# Patient Record
Sex: Male | Born: 1940 | Race: White | Hispanic: No | Smoking: Former smoker
Health system: Southern US, Community
[De-identification: ages and names within clinical notes are randomized; demographics above are authoritative.]

## PROBLEM LIST (undated history)

## (undated) DIAGNOSIS — I480 Paroxysmal atrial fibrillation: Secondary | ICD-10-CM

## (undated) DIAGNOSIS — D649 Anemia, unspecified: Secondary | ICD-10-CM

## (undated) DIAGNOSIS — I2510 Atherosclerotic heart disease of native coronary artery without angina pectoris: Secondary | ICD-10-CM

## (undated) DIAGNOSIS — Z4931 Encounter for adequacy testing for hemodialysis: Secondary | ICD-10-CM

## (undated) DIAGNOSIS — R55 Syncope and collapse: Secondary | ICD-10-CM

## (undated) DIAGNOSIS — N19 Unspecified kidney failure: Secondary | ICD-10-CM

## (undated) DIAGNOSIS — I10 Essential (primary) hypertension: Secondary | ICD-10-CM

## (undated) DIAGNOSIS — E039 Hypothyroidism, unspecified: Secondary | ICD-10-CM

## (undated) DIAGNOSIS — I493 Ventricular premature depolarization: Secondary | ICD-10-CM

## (undated) HISTORY — DX: Essential (primary) hypertension: I10

## (undated) HISTORY — DX: Ventricular premature depolarization: I49.3

## (undated) HISTORY — DX: Syncope and collapse: R55

## (undated) HISTORY — DX: Encounter for adequacy testing for hemodialysis: Z49.31

## (undated) HISTORY — DX: Atherosclerotic heart disease of native coronary artery without angina pectoris: I25.10

## (undated) HISTORY — DX: Paroxysmal atrial fibrillation: I48.0

## (undated) HISTORY — DX: Hypothyroidism, unspecified: E03.9

## (undated) HISTORY — PX: OTHER SURGICAL HISTORY: SHX169

## (undated) HISTORY — PX: CHOLECYSTECTOMY: SHX55

## (undated) HISTORY — DX: Anemia, unspecified: D64.9

## (undated) HISTORY — DX: Unspecified kidney failure: N19

## (undated) HISTORY — PX: CATARACT EXTRACTION: SUR2

---

## 2001-05-07 ENCOUNTER — Ambulatory Visit (HOSPITAL_COMMUNITY): Admission: RE | Admit: 2001-05-07 | Discharge: 2001-05-08 | Payer: Self-pay | Admitting: Ophthalmology

## 2001-05-07 ENCOUNTER — Encounter: Payer: Self-pay | Admitting: Ophthalmology

## 2005-10-24 ENCOUNTER — Encounter: Payer: Self-pay | Admitting: Cardiology

## 2009-01-03 ENCOUNTER — Ambulatory Visit (HOSPITAL_COMMUNITY): Admission: RE | Admit: 2009-01-03 | Discharge: 2009-01-04 | Payer: Self-pay | Admitting: Obstetrics and Gynecology

## 2009-07-26 ENCOUNTER — Ambulatory Visit: Payer: Self-pay | Admitting: Cardiology

## 2009-07-26 ENCOUNTER — Encounter: Payer: Self-pay | Admitting: Cardiology

## 2009-07-26 ENCOUNTER — Encounter: Payer: Self-pay | Admitting: Physician Assistant

## 2009-07-27 ENCOUNTER — Encounter: Payer: Self-pay | Admitting: Cardiology

## 2009-07-28 ENCOUNTER — Encounter: Payer: Self-pay | Admitting: Cardiology

## 2009-07-30 ENCOUNTER — Ambulatory Visit: Payer: Self-pay | Admitting: Cardiology

## 2009-07-30 ENCOUNTER — Encounter: Payer: Self-pay | Admitting: Cardiology

## 2009-07-30 ENCOUNTER — Inpatient Hospital Stay (HOSPITAL_COMMUNITY): Admission: RE | Admit: 2009-07-30 | Discharge: 2009-08-04 | Payer: Self-pay | Admitting: Cardiology

## 2009-08-01 ENCOUNTER — Encounter: Payer: Self-pay | Admitting: Internal Medicine

## 2009-08-03 ENCOUNTER — Encounter: Payer: Self-pay | Admitting: Cardiology

## 2009-08-04 ENCOUNTER — Ambulatory Visit: Payer: Self-pay | Admitting: Vascular Surgery

## 2009-08-04 ENCOUNTER — Encounter: Payer: Self-pay | Admitting: Cardiology

## 2009-08-04 ENCOUNTER — Encounter (INDEPENDENT_AMBULATORY_CARE_PROVIDER_SITE_OTHER): Payer: Self-pay | Admitting: Nephrology

## 2009-08-07 ENCOUNTER — Encounter: Payer: Self-pay | Admitting: Internal Medicine

## 2009-08-07 ENCOUNTER — Ambulatory Visit: Payer: Self-pay | Admitting: Cardiology

## 2009-08-07 ENCOUNTER — Telehealth: Payer: Self-pay | Admitting: Internal Medicine

## 2009-08-07 ENCOUNTER — Encounter: Payer: Self-pay | Admitting: Cardiology

## 2009-08-08 ENCOUNTER — Encounter: Payer: Self-pay | Admitting: Internal Medicine

## 2009-08-16 ENCOUNTER — Encounter: Payer: Self-pay | Admitting: Cardiology

## 2009-08-28 ENCOUNTER — Ambulatory Visit: Payer: Self-pay | Admitting: Surgery

## 2009-08-28 DIAGNOSIS — E1169 Type 2 diabetes mellitus with other specified complication: Secondary | ICD-10-CM

## 2009-08-28 DIAGNOSIS — I498 Other specified cardiac arrhythmias: Secondary | ICD-10-CM

## 2009-08-28 DIAGNOSIS — N179 Acute kidney failure, unspecified: Secondary | ICD-10-CM

## 2009-08-28 DIAGNOSIS — I2510 Atherosclerotic heart disease of native coronary artery without angina pectoris: Secondary | ICD-10-CM | POA: Insufficient documentation

## 2009-08-28 DIAGNOSIS — R55 Syncope and collapse: Secondary | ICD-10-CM | POA: Insufficient documentation

## 2009-08-28 DIAGNOSIS — D649 Anemia, unspecified: Secondary | ICD-10-CM

## 2009-08-28 DIAGNOSIS — E039 Hypothyroidism, unspecified: Secondary | ICD-10-CM | POA: Insufficient documentation

## 2009-08-28 DIAGNOSIS — E785 Hyperlipidemia, unspecified: Secondary | ICD-10-CM | POA: Insufficient documentation

## 2009-08-28 DIAGNOSIS — I6529 Occlusion and stenosis of unspecified carotid artery: Secondary | ICD-10-CM | POA: Insufficient documentation

## 2009-08-28 DIAGNOSIS — E1129 Type 2 diabetes mellitus with other diabetic kidney complication: Secondary | ICD-10-CM

## 2009-08-28 DIAGNOSIS — I499 Cardiac arrhythmia, unspecified: Secondary | ICD-10-CM | POA: Insufficient documentation

## 2009-08-28 DIAGNOSIS — I120 Hypertensive chronic kidney disease with stage 5 chronic kidney disease or end stage renal disease: Secondary | ICD-10-CM | POA: Insufficient documentation

## 2009-08-28 DIAGNOSIS — E875 Hyperkalemia: Secondary | ICD-10-CM

## 2009-08-29 ENCOUNTER — Encounter: Payer: Self-pay | Admitting: Cardiology

## 2009-08-29 ENCOUNTER — Ambulatory Visit: Payer: Self-pay | Admitting: Cardiology

## 2009-08-29 DIAGNOSIS — I259 Chronic ischemic heart disease, unspecified: Secondary | ICD-10-CM

## 2009-08-29 DIAGNOSIS — I2510 Atherosclerotic heart disease of native coronary artery without angina pectoris: Secondary | ICD-10-CM | POA: Insufficient documentation

## 2009-09-05 ENCOUNTER — Ambulatory Visit (HOSPITAL_COMMUNITY): Admission: RE | Admit: 2009-09-05 | Discharge: 2009-09-05 | Payer: Self-pay | Admitting: Surgery

## 2009-09-05 ENCOUNTER — Ambulatory Visit: Payer: Self-pay | Admitting: Surgery

## 2009-09-19 ENCOUNTER — Ambulatory Visit: Payer: Self-pay | Admitting: Vascular Surgery

## 2009-09-19 ENCOUNTER — Encounter: Payer: Self-pay | Admitting: Cardiology

## 2009-09-22 ENCOUNTER — Ambulatory Visit (HOSPITAL_COMMUNITY): Admission: RE | Admit: 2009-09-22 | Discharge: 2009-09-22 | Payer: Self-pay | Admitting: Vascular Surgery

## 2009-09-29 ENCOUNTER — Encounter: Payer: Self-pay | Admitting: Cardiology

## 2009-10-01 ENCOUNTER — Encounter: Payer: Self-pay | Admitting: Cardiology

## 2009-10-01 ENCOUNTER — Ambulatory Visit: Payer: Self-pay | Admitting: Cardiology

## 2009-10-02 ENCOUNTER — Encounter: Payer: Self-pay | Admitting: Cardiology

## 2009-10-03 ENCOUNTER — Encounter: Payer: Self-pay | Admitting: Cardiology

## 2009-10-04 ENCOUNTER — Encounter: Payer: Self-pay | Admitting: Cardiology

## 2009-10-06 ENCOUNTER — Encounter: Payer: Self-pay | Admitting: Cardiology

## 2009-10-14 ENCOUNTER — Emergency Department (HOSPITAL_COMMUNITY): Admission: EM | Admit: 2009-10-14 | Discharge: 2009-10-14 | Payer: Self-pay | Admitting: Emergency Medicine

## 2009-10-15 ENCOUNTER — Inpatient Hospital Stay (HOSPITAL_COMMUNITY): Admission: EM | Admit: 2009-10-15 | Discharge: 2009-10-17 | Payer: Self-pay | Admitting: Emergency Medicine

## 2009-10-16 ENCOUNTER — Ambulatory Visit: Payer: Self-pay | Admitting: Vascular Surgery

## 2009-10-16 ENCOUNTER — Encounter: Payer: Self-pay | Admitting: Vascular Surgery

## 2009-10-18 ENCOUNTER — Encounter: Payer: Self-pay | Admitting: Cardiology

## 2009-10-23 ENCOUNTER — Telehealth: Payer: Self-pay | Admitting: Internal Medicine

## 2009-10-25 ENCOUNTER — Ambulatory Visit: Payer: Self-pay | Admitting: Internal Medicine

## 2009-11-01 ENCOUNTER — Ambulatory Visit: Payer: Self-pay | Admitting: Vascular Surgery

## 2009-11-10 ENCOUNTER — Ambulatory Visit: Payer: Self-pay | Admitting: Internal Medicine

## 2009-11-13 ENCOUNTER — Ambulatory Visit: Payer: Self-pay | Admitting: Vascular Surgery

## 2009-11-13 ENCOUNTER — Ambulatory Visit (HOSPITAL_COMMUNITY): Admission: RE | Admit: 2009-11-13 | Discharge: 2009-11-13 | Payer: Self-pay | Admitting: Vascular Surgery

## 2009-11-13 ENCOUNTER — Emergency Department (HOSPITAL_COMMUNITY): Admission: EM | Admit: 2009-11-13 | Discharge: 2009-11-13 | Payer: Self-pay | Admitting: Emergency Medicine

## 2009-12-15 ENCOUNTER — Ambulatory Visit: Payer: Self-pay | Admitting: Vascular Surgery

## 2009-12-22 ENCOUNTER — Encounter: Payer: Self-pay | Admitting: Cardiology

## 2010-01-01 ENCOUNTER — Telehealth: Payer: Self-pay | Admitting: Internal Medicine

## 2010-01-03 ENCOUNTER — Ambulatory Visit: Payer: Self-pay | Admitting: Cardiology

## 2010-03-26 ENCOUNTER — Ambulatory Visit (HOSPITAL_COMMUNITY): Admission: RE | Admit: 2010-03-26 | Discharge: 2010-03-26 | Payer: Self-pay | Admitting: Nephrology

## 2010-03-26 ENCOUNTER — Encounter: Payer: Self-pay | Admitting: Cardiology

## 2010-06-01 ENCOUNTER — Ambulatory Visit: Payer: Self-pay | Admitting: Internal Medicine

## 2010-07-20 ENCOUNTER — Encounter: Payer: Self-pay | Admitting: Cardiology

## 2010-07-20 ENCOUNTER — Ambulatory Visit (HOSPITAL_COMMUNITY): Admission: RE | Admit: 2010-07-20 | Discharge: 2010-07-20 | Payer: Self-pay | Admitting: Nephrology

## 2010-08-03 ENCOUNTER — Ambulatory Visit: Payer: Self-pay | Admitting: Cardiology

## 2010-11-01 ENCOUNTER — Encounter: Payer: Self-pay | Admitting: Internal Medicine

## 2010-11-11 ENCOUNTER — Encounter: Payer: Self-pay | Admitting: Nephrology

## 2010-11-20 NOTE — Assessment & Plan Note (Signed)
Summary: pc2   Current Medications (verified): 1)  Metoprolol Succinate 50 Mg Xr24h-Tab (Metoprolol Succinate) .... Take 1 Tablet By Mouth Once A Day 2)  Simvastatin 40 Mg Tabs (Simvastatin) .... Take 1 Tablet By Mouth Once A Day 3)  Aspirin 325 Mg Tabs (Aspirin) .... Take 1 Tablet By Mouth Once A Day 4)  Calcium Acetate 667 Mg Caps (Calcium Acetate (Phos Binder)) .... Take 1 Tablet By Mouth Three Times A Day With Meals 5)  Humulin N 100 Unit/ml Susp (Insulin Isophane Human) .... Use As Directed 6)  Levothroid 175 Mcg Tabs (Levothyroxine Sodium) .... Take 1 Tablet By Mouth Once A Day 7)  Plavix 75 Mg Tabs (Clopidogrel Bisulfate) .... Take 1 Tablet By Mouth Once A Day 8)  Dialyvite 3000 3 Mg Tabs (B Complex-C-Biotin-E-Min-Fa) .... Take 1 Tablet By Mouth Once A Day  Allergies (verified): No Known Drug Allergies  Past Cardiac History:  [facility] Hospitalizations: FINAL DIAGNOSES: 1. Admitted to [facility] with syncope.     a.     No dysrhythmia on telemetry at [facility] on October      8 and October 9. 2. Myoview study done at [facility].     a.     Multiple large fixed defects.     b.     Ejection fraction 39%. 3. Echocardiogram at [facility] on October 7, ejection fraction of 50-     55%. 4. Stage IV chronic kidney disease.     a.     Secondary to diabetes and hypertension.     b.     Impending hemodialysis.     c.     Vein mapping faxed to [facility] at [phone] on October 15. 5. Nephrology consult this admission.     a.     Suggesting we hold ramipril and Diovan.     b.     Ordered vein mapping study. 6. Left heart catheterization on July 31, 2009, a diagnostic study.     a.     The patient had a 99% stenosis in the left anterior      descending after the diagonal.     b.     Severe distal right coronary artery/posterior descending      artery/posterolateral branches 1 and 2 disease 90% stenoses all      around. 7. Followup echocardiogram on August 01, 2009, akinesis of  the apex     (left anterior descending not a candidate for percutaneous coronary     intervention, but the right coronary artery is a candidate for     percutaneous coronary intervention). 8. Percutaneous transluminal coronary angioplasty stenting x4 on     August 02, 2009.  Stents were placed in the posterior descending     artery, the posterolateral branches 1and 2 and the distal right     coronary artery, 4 drug-eluting stents.     a.     Aspirin and Plavix for 12 months longer if tolerated. 9. Electrophysiology study on August 04, 2009.     a.     Non-inducible ventricular tachycardia/ventricular      fibrillation.     b.     Loop implanted a Medtronic Reveal, the patient discharging      on August 04, 2009. 07/30/2009 - 09/04/2009:  Angina (syncope)    ILR Following MD Hillis Range, MD DOI:  08/04/2009 Vendor:  Medtronic     Model Number:  [id number]     Serial Number [id number]  Tachy Episodes:  0     Brady Episodes:  0 ILR Next Due 08/21/2010  Tech Comments:  NORMAL LOOP CHECK. NO EPISODES SINCE LAST CHECK.  PT HAVING NO PROBLEMS.  ROV IN 3 MTHS W/JA. Vella Kohler  June 04, 2010 2:45 PM

## 2010-11-20 NOTE — Assessment & Plan Note (Signed)
Summary: pc2 medtronic   Visit Type:  Pacemaker check Primary Provider:  Tapper  CC:  pacercheck.  History of Present Illness: The patient presents today for routine electrophysiology followup. He reports doing very well since last being seen in our clinic.  He denies any further episodes of syncope.  The patient denies symptoms of palpitations, chest pain, shortness of breath, orthopnea, PND, lower extremity edema, dizziness, presyncope, syncope, or neurologic sequela. The patient is tolerating medications without difficulties and is otherwise without complaint today.   Preventive Screening-Counseling & Management  Alcohol-Tobacco     Smoking Status: quit     Year Quit: 1988  Current Medications (verified): 1)  Amlodipine Besylate 5 Mg Tabs (Amlodipine Besylate) .... Take 1 Tablet By Mouth Once A Day 2)  Metoprolol Succinate 50 Mg Xr24h-Tab (Metoprolol Succinate) .... Take 1 Tablet By Mouth Once A Day 3)  Simvastatin 40 Mg Tabs (Simvastatin) .... Take 1 Tablet By Mouth Once A Day 4)  Aspirin 325 Mg Tabs (Aspirin) .... Take 1 Tablet By Mouth Once A Day 5)  Calcium Acetate 667 Mg Caps (Calcium Acetate (Phos Binder)) .... Take 1 Tablet By Mouth Three Times A Day With Meals 6)  Humulin N 100 Unit/ml Susp (Insulin Isophane Human) .... Use As Directed 7)  Levothroid 175 Mcg Tabs (Levothyroxine Sodium) .... Take 1 Tablet By Mouth Once A Day 8)  Plavix 75 Mg Tabs (Clopidogrel Bisulfate) .... Take 1 Tablet By Mouth Once A Day 9)  Dialyvite 3000 3 Mg Tabs (B Complex-C-Biotin-E-Min-Fa) .... Take 1 Tablet By Mouth Once A Day 10)  Lorazepam 0.5 Mg Tabs (Lorazepam) .... Take 1 Tablet By Mouth Two Times A Day  Allergies (verified): No Known Drug Allergies  Comments:  Nurse/Medical Assistant: The patient's medications and allergies were reviewed with the patient and were updated in the Medication and Allergy Lists. List reviewed.  Past History:  Past Medical History: Recurrent unexplained  syncope noniducible EP study ILR placed  Persumed coronary artery disease  a. Abnormal perfusion imaging study with multipe, large fixed defects, EF 39%  b. EF 50-55 % with apical akinesis by 2 D Echo Ventricular ectopy a. no documented nonsustained ventricular tachycardia Multiple cardiac risk factors a. Insulin dependent Diabetes mellitus b. hypertension c. History of Tobacco d. Age Chronic Renal Failure Chronic Anemia Hypothyroidism status post stent to the LAD 07/30/2009 a. 50-69% right ICA stenois on this admission  Past Surgical History: Reviewed history from 08/28/2009 and no changes required. Cataract Extraction Cholecystectomy  Past Cardiac History:  [facility] Hospitalizations: FINAL DIAGNOSES: 1. Admitted to [facility] with syncope.     a.     No dysrhythmia on telemetry at [facility] on October      8 and October 9. 2. Myoview study done at [facility].     a.     Multiple large fixed defects.     b.     Ejection fraction 39%. 3. Echocardiogram at [facility] on October 7, ejection fraction of 50-     55%. 4. Stage IV chronic kidney disease.     a.     Secondary to diabetes and hypertension.     b.     Impending hemodialysis.     c.     Vein mapping [facility]ed to [facility] at [phone] on October 15. 5. Nephrology consult this admission.     a.     Suggesting we hold ramipril and Diovan.     b.     Ordered vein mapping study.  6. Left heart catheterization on July 31, 2009, a diagnostic study.     a.     The patient had a 99% stenosis in the left anterior      descending after the diagonal.     b.     Severe distal right coronary artery/posterior descending      artery/posterolateral branches 1 and 2 disease 90% stenoses all      around. 7. Followup echocardiogram on August 01, 2009, akinesis of the apex     (left anterior descending not a candidate for percutaneous coronary     intervention, but the right coronary artery is a candidate for     percutaneous  coronary intervention). 8. Percutaneous transluminal coronary angioplasty stenting x4 on     August 02, 2009.  Stents were placed in the posterior descending     artery, the posterolateral branches 1and 2 and the distal right     coronary artery, 4 drug-eluting stents.     a.     Aspirin and Plavix for 12 months longer if tolerated. 9. Electrophysiology study on August 04, 2009.     a.     Non-inducible ventricular tachycardia/ventricular      fibrillation.     b.     Loop implanted a Medtronic Reveal, the patient discharging      on August 04, 2009. 07/30/2009 - 09/04/2009:  Angina (syncope)  Social History: Reviewed history from 08/28/2009 and no changes required. Widowed  Alcohol Use - no Retired (worked Haematologist a Arboriculturist) smoked cigars but quit smoking in 1987 after smoking for 20 yrs.  Vital Signs:  Patient profile:   70 year old male Height:      72 inches Weight:      170 pounds Pulse rate:   80 / minute BP sitting:   182 / 113  (left arm) Cuff size:   regular  Vitals Entered By: Carlye Grippe (November 10, 2009 1:44 PM)  Serial Vital Signs/Assessments:  Time      Position  BP       Pulse  Resp  Temp     By 1:54 PM             164/84   79                    Carlye Grippe  CC: pacercheck   Physical Exam  General:  Well developed, well nourished, in no acute distress. Head:  normocephalic and atraumatic Eyes:  PERRLA/EOM intact; conjunctiva and lids normal. Mouth:  Teeth, gums and palate normal. Oral mucosa normal. Neck:  Neck supple, no JVD. No masses, thyromegaly or abnormal cervical nodes. Chest Wall:  ILR site well healed Lungs:  Clear bilaterally to auscultation and percussion. Heart:  Non-displaced PMI, chest non-tender; regular rate and rhythm, S1, S2 without murmurs, rubs or gallops. Carotid upstroke normal, no bruit. Normal abdominal aortic size, no bruits. Femorals normal pulses, no bruits. Pedals normal pulses. No edema, no  varicosities. Abdomen:  Bowel sounds positive; abdomen soft and non-tender without masses, organomegaly, or hernias noted. No hepatosplenomegaly. Msk:  Back normal, normal gait. Muscle strength and tone normal. Pulses:  pulses normal in all 4 extremities Extremities:  No clubbing or cyanosis. Neurologic:  Alert and oriented x 3.    ILR Following MD Hillis Range, MD DOI:  08/04/2009 Vendor:  Medtronic     Model Number:  [id number]     Serial Number [id number]  Tech Comments:  No episodes of any arrythmias.  ROV 6 months [facility]. [employer] RN BSN  November 10, 2009 2:13 PM   MD Comments:  agree  Impression & Recommendations:  Problem # 1:  SYNCOPE AND COLLAPSE (ICD-780.2) No further episodes of syncope ILR reviewed as above No changes today Pt to contact us if further syncope  His updated medication list for this problem includes:    Amlodipine Besylate 5 Mg Tabs (Amlodipine besylate) .Marland Kitchen... Take 1 tablet by mouth once a day    Metoprolol Succinate 50 Mg Xr24h-tab (Metoprolol succinate) .Marland Kitchen... Take 1 tablet by mouth once a day    Aspirin 325 Mg Tabs (Aspirin) .Marland Kitchen... Take 1 tablet by mouth once a day    Plavix 75 Mg Tabs (Clopidogrel bisulfate) .Marland Kitchen... Take 1 tablet by mouth once a day  Problem # 2:  HTN CKD UNSPEC W/CKD STAGE V/ESRD (ICD-403.91) BP above goal Pt reluctant to increase meds due to hypotension with HD Salt restriction advised  The following medications were removed from the medication list:    Furosemide 20 Mg Tabs (Furosemide) .Marland Kitchen... Take 1 tablet by mouth once a day His updated medication list for this problem includes:    Amlodipine Besylate 5 Mg Tabs (Amlodipine besylate) .Marland Kitchen... Take 1 tablet by mouth once a day    Metoprolol Succinate 50 Mg Xr24h-tab (Metoprolol succinate) .Marland Kitchen... Take 1 tablet by mouth once a day    Aspirin 325 Mg Tabs (Aspirin) .Marland Kitchen... Take 1 tablet by mouth once a day  Problem # 3:  LEFT VENTRICULAR FUNCTION, DECREASED  (ICD-429.2) Stable no changes  Patient Instructions: 1)  return in 6 months

## 2010-11-20 NOTE — Consult Note (Signed)
Summary: CARDIOLOGY CONSULT [facility]  CARDIOLOGY CONSULT [facility]   Imported By: Zachary George 01/02/2010 19:42:19  _____________________________________________________________________  External Attachment:    Type:   Image     Comment:   External Document

## 2010-11-20 NOTE — Cardiovascular Report (Signed)
Summary: Office Visit   Office Visit   Imported By: Roderic Ovens 06/11/2010 13:14:08  _____________________________________________________________________  External Attachment:    Type:   Image     Comment:   External Document

## 2010-11-20 NOTE — Assessment & Plan Note (Signed)
Summary: 6 mo fu per spet reminder-srs   Visit Type:  Follow-up Primary Provider:  Dr. Wyvonnia Lora   History of Present Illness: the patient is a 70 year old male with end-stage renal disease on hemodialysis, anemia and trouble cytopenia. Has a prior history of syncope which has been evaluated with an implantable loop monitor as well as an EP study which was negative for inducible ventricular tachycardia. Interestingly the patient's episodes of syncope have resolved and he is doing much better. He denies any chest pain shortness of breath orthopnea or PND.  Recently he had a poor healing foot ulcer and underwent ABIs. He was found to have bilateral lower showed the arterial occlusive disease primarily infrapopliteal of moderate to severe degree. His foot ulcer has healed. The patient denies any claudications.  An echocardiogram done 6 months ago was noted for essentially near normal ejection fraction of 45-50% with segmental wall motion abnormalities. The patient denies having chest pain.the patient underwent prior stenting of the right PDA right heel one and pO2 and distal right coronary artery with 4 drug eluting stents. The recommendation is lifelong therapy with dual antiplatelet therapy.  Preventive Screening-Counseling & Management  Alcohol-Tobacco     Smoking Status: quit  Comments: Pt smoked cigars but quit in the 80's.  Current Medications (verified): 1)  Metoprolol Succinate 50 Mg Xr24h-Tab (Metoprolol Succinate) .... Take 1 Tablet By Mouth Once A Day 2)  Simvastatin 40 Mg Tabs (Simvastatin) .... Take 1 Tablet By Mouth Once A Day 3)  Aspirin 325 Mg Tabs (Aspirin) .... Take 1 Tablet By Mouth Once A Day 4)  Calcium Acetate 667 Mg Caps (Calcium Acetate (Phos Binder)) .... Take 1 Tablet By Mouth Three Times A Day With Meals 5)  Humulin N 100 Unit/ml Susp (Insulin Isophane Human) .... Use As Directed 6)  Levothroid 175 Mcg Tabs (Levothyroxine Sodium) .... Take 1 Tablet By Mouth Once  A Day 7)  Plavix 75 Mg Tabs (Clopidogrel Bisulfate) .... Take 1 Tablet By Mouth Once A Day 8)  Dialyvite 3000 3 Mg Tabs (B Complex-C-Biotin-E-Min-Fa) .... Take 1 Tablet By Mouth Once A Day 9)  Miralax  Powd (Polyethylene Glycol 3350) .... Use As Directed As Needed  Allergies: No Known Drug Allergies  Comments:  Nurse/Medical Assistant: The patient's medications were reviewed with the patient and were updated in the Medication List. Pt brought a list of medications to office visit.  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  Risk Factors: Smoking Status: quit (08/03/2010)  Past Medical History: Recurrent unexplained syncope noniducible EP study ILR placed  coronary artery disease  a. Abnormal perfusion imaging study with multipe, large fixed defects, EF 39%  b. EF 50-55 % with apical akinesis by 2 D Echo Status post coronary intervention with drug-eluting stent placement x 24 July 2009 ejection fraction 40 to 45%. Ventricular ectopy a. no documented nonsustained ventricular tachycardia Multiple cardiac risk factors a. Insulin dependent Diabetes mellitus b. hypertension c. History of Tobacco d. Age Chronic Renal Failure, on hemodialysis. Chronic Anemia Hypothyroidism status post stent to the LAD 07/30/2009 a. 50-69% right ICA stenois on this admission status post septicemia with Serratia and  infected left arm fistula.  Status post removal left arm fistula with new right arm fistula.  Hemodialysis through a Vas-Cath catheter ABIs March 2011 bilateral lower sugar to  loosen disease primarily infrapopliteal moderate in severity MRA recommended if fails conservative therapy.  Past Cardiac History:  [facility] Hospitalizations: FINAL DIAGNOSES: 1. Admitted to [facility] with syncope.     a.     No dysrhythmia on telemetry at [facility] on October      8 and October 9. 2. Myoview study done at [facility].     a.     Multiple large fixed defects.     b.     Ejection fraction 39%. 3. Echocardiogram at [facility] on October 7, ejection fraction of 50-     55%. 4. Stage IV chronic kidney disease.     a.     Secondary to diabetes and hypertension.     b.     Impending hemodialysis.     c.     Vein mapping [facility]ed to [facility] at [phone] on October 15. 5. Nephrology consult this admission.     a.     Suggesting we hold ramipril and Diovan.     b.     Ordered vein mapping study. 6. Left heart catheterization on July 31, 2009, a diagnostic study.     a.     The patient had a 99% stenosis in the left anterior      descending after the diagonal.     b.     Severe distal right coronary artery/posterior descending      artery/posterolateral branches 1 and 2 disease 90% stenoses all      around. 7. Followup echocardiogram on August 01, 2009, akinesis of the apex     (left anterior descending not a candidate for percutaneous coronary     intervention, but the right coronary artery is a candidate for     percutaneous coronary intervention). 8. Percutaneous transluminal coronary angioplasty stenting x4 on     August 02, 2009.  Stents were placed in the posterior descending     artery, the posterolateral branches 1and 2 and the distal right     coronary artery, 4 drug-eluting stents.     a.     Aspirin and Plavix for 12 months longer if tolerated. 9. Electrophysiology study on August 04, 2009.     a.     Non-inducible ventricular tachycardia/ventricular      fibrillation.     b.     Loop implanted a Medtronic Reveal, the patient discharging      on August 04, 2009. 07/30/2009 - 09/04/2009:  Angina (syncope)  Social History: Smoking Status:  quit  Review of Systems  The patient denies fatigue, malaise, fever, weight gain/loss, vision loss, decreased hearing, hoarseness, chest pain, palpitations, shortness of breath, prolonged cough, wheezing, sleep apnea, coughing up blood, abdominal pain, blood in stool, nausea, vomiting, diarrhea, heartburn, incontinence, blood in urine, muscle weakness, joint pain, leg swelling, rash, skin lesions, headache, fainting, dizziness, depression, anxiety, enlarged lymph nodes, easy bruising or bleeding, and environmental allergies.    Vital Signs:  Patient profile:   70 year old male Height:      72 inches Weight:      189.25 pounds BMI:     25.76 Pulse rate:   60 / minute BP sitting:   165 / 81  (left arm) Cuff size:   regular  Vitals Entered By: Cyril Loosen, RN, BSN (August 03, 2010 10:34 AM)  Nutrition Counseling: Patient's BMI is greater than 25 and therefore counseled on weight management options. Comments Follow up visit. No cardiac complaints.   Physical Exam  Additional Exam:  General: Well-developed, well-nourished in no distress head: Normocephalic and atraumatic eyes PERRLA/EOMI intact, conjunctiva and lids normal nose: No deformity or lesions mouth normal dentition, normal posterior pharynx neck: Supple, no JVD.  No masses, thyromegaly or abnormal cervical nodes Chest: Vas-Cath catheter in place, lungs: Normal breath sounds bilaterally without wheezing.  Normal percussion heart: regular rate and rhythm with normal S1 and S2, no S3 or S4.  PMI is normal.  No pathological murmurs abdomen: Normal bowel sounds, abdomen is soft and nontender without masses, organomegaly or hernias noted.  No hepatosplenomegaly musculoskeletal: Back normal, normal gait muscle strength and tone normal pulsus: Pulse is normal in all 4 extremities Extremities: No peripheral pitting edema, status post removal  left arm fistula and new right arm fistula. neurologic: Alert and oriented x 3 skin: Intact without lesions or rashes cervical nodes: No significant adenopathy psychologic: Normal affect     ILR Following MD Hillis Range, MD DOI:  08/04/2009 Vendor:  Medtronic     Model Number:  [id number]     Serial Number [id number]        Impression & Recommendations:  Problem # 1:  LEFT VENTRICULAR FUNCTION, DECREASED (ICD-429.2) mild LV dysfunction but no heart failure symptoms. Continue current medical therapy.  Problem # 2:  CORONARY ARTERY DISEASE, S/P PTCA (ICD-414.9) status post multiple drug eluting stent placements. No recurrent chest pain. Continue dual antiplatelet therapy. His updated medication list for this problem includes:    Metoprolol Succinate 50 Mg Xr24h-tab (Metoprolol succinate) .Marland Kitchen... Take 1 tablet by mouth once a day    Aspirin 325 Mg Tabs (Aspirin) .Marland Kitchen... Take 1 tablet by mouth once a day    Plavix 75 Mg Tabs (Clopidogrel bisulfate) .Marland Kitchen... Take 1 tablet by mouth once a day  Problem # 3:  HTN CKD UNSPEC W/CKD STAGE V/ESRD (ICD-403.91) patient is on hemodialysis. He has had no recurrent syncope His updated medication list for this problem includes:    Metoprolol Succinate 50 Mg Xr24h-tab (Metoprolol succinate) .Marland Kitchen... Take 1 tablet by mouth once a day    Aspirin 325 Mg Tabs (Aspirin) .Marland Kitchen... Take 1 tablet by mouth once a day  Problem # 4:  HYPERLIPIDEMIA (ICD-272.4) followed by his primary care physician His updated medication list for this problem includes:    Simvastatin 40 Mg Tabs (Simvastatin) .Marland Kitchen... Take 1 tablet by mouth once a day  Appended Document: [facility]     Allergies: No Known Drug Allergies  Past Cardiac History:  [facility] Hospitalizations: FINAL DIAGNOSES: 1. Admitted to [facility] with syncope.     a.     No dysrhythmia on telemetry at [facility] on October      8 and October 9. 2. Myoview study done at [facility].     a.     Multiple  large fixed defects.     b.     Ejection fraction 39%. 3. Echocardiogram at [facility] on October 7, ejection fraction of 50-     55%. 4. Stage IV chronic kidney disease.     a.     Secondary to diabetes and hypertension.     b.     Impending hemodialysis.     c.     Vein [facility] at [phone] on October 15. 5. Nephrology consult this admission.  a.     Suggesting we hold ramipril and Diovan.     b.     Ordered vein mapping study. 6. Left heart catheterization on July 31, 2009, a diagnostic study.     a.     The patient had a 99% stenosis in the left anterior      descending after the diagonal.     b.     Severe distal right coronary artery/posterior descending      artery/posterolateral branches 1 and 2 disease 90% stenoses all      around. 7. Followup echocardiogram on August 01, 2009, akinesis of the apex     (left anterior descending not a candidate for percutaneous coronary     intervention, but the right coronary artery is a candidate for     percutaneous coronary intervention). 8. Percutaneous transluminal coronary angioplasty stenting x4 on     August 02, 2009.  Stents were placed in the posterior descending     artery, the posterolateral branches 1and 2 and the distal right     coronary artery, 4 drug-eluting stents.     a.     Aspirin and Plavix for 12 months longer if tolerated. 9. Electrophysiology study on August 04, 2009.     a.     Non-inducible ventricular tachycardia/ventricular      fibrillation.     b.     Loop implanted a Medtronic Reveal, the patient discharging      on August 04, 2009. 07/30/2009 - 09/04/2009:  Angina (syncope)    ILR Following MD Hillis Range, MD DOI:  08/04/2009 Vendor:  Medtronic     Model Number:  [id number]     Serial Number [id number]        Patient Instructions: 1)  Your physician recommends that you continue on your current medications as directed. Please refer to the Current Medication list given to you today. 2)   .Follow up in  6 months

## 2010-11-20 NOTE — Consult Note (Signed)
Summary: Consultation Report  (DR. Kristian Covey)  Consultation Report  (DR. Kristian Covey)   Imported By: Zachary George 01/02/2010 19:46:37  _____________________________________________________________________  External Attachment:    Type:   Image     Comment:   External Document

## 2010-11-20 NOTE — Procedures (Signed)
Summary: loop [email or web address]   Current Medications (verified): 1)  Amlodipine Besylate 10 Mg Tabs (Amlodipine Besylate) .... Take 1 Tablet By Mouth Once A Day 2)  Metoprolol Succinate 50 Mg Xr24h-Tab (Metoprolol Succinate) .... Take 1 Tablet By Mouth Once A Day 3)  Simvastatin 40 Mg Tabs (Simvastatin) .... Take 1 Tablet By Mouth Once A Day 4)  Aspirin 325 Mg Tabs (Aspirin) .... Take 1 Tablet By Mouth Once A Day 5)  Calcitriol 0.25 Mcg Caps (Calcitriol) .... Take 1 Tablet By Mouth Once A Day 6)  Calcium Acetate 667 Mg Caps (Calcium Acetate (Phos Binder)) .... Take 1 Tablet By Mouth Three Times A Day With Meals 7)  Humulin N 100 Unit/ml Susp (Insulin Isophane Human) .... Use As Directed 8)  Levothroid 175 Mcg Tabs (Levothyroxine Sodium) .... Take 1 Tablet By Mouth Once A Day 9)  Furosemide 20 Mg Tabs (Furosemide) .... Take 1 Tablet By Mouth Once A Day 10)  Plavix 75 Mg Tabs (Clopidogrel Bisulfate) .... Take 1 Tablet By Mouth Once A Day  Allergies (verified): No Known Drug Allergies   ILR Following MD Hillis Range, MD DOI:  08/04/2009 Vendor:  Medtronic     Model Number:  [id number]     Serial Number [id number]       Tachy Episodes:  4     Brady Episodes:  0 ILR Next Due 11/10/2009  Tech Comments:  Patient with syncopal episode on 12-31 while eating at a restuarant.  Reports got dizzy, diaphoretic, then passed out.  EMS was called, they tried to use activator, but nothing was recorded.  Tachy episodes recorded as afib are frequent PVC's.  Nothing from date of episode.  Re-educated on use of activator.  Pt to keep appt 1-21 with Dr Johney Frame. Gypsy Balsam RN BSN  October 25, 2009 10:42 AM   MD Comments:  agree.  Sinus with late coupled PVCs documented.  No arrhythmias documented 10/20/09, however, pt did not successfully activate the device.

## 2010-11-20 NOTE — Progress Notes (Signed)
Summary: event recorded Friday  Phone Note Call from Patient Call back at [phone]   Caller: Daughter Vicie Mutters Summary of Call: Patient had to record an event on Friday.  EMS was called, patient refused to go to hospital.  Blood pressure dropped a bit, but patient is feeling better today.  Initial call taken by: Burnard Leigh,  October 23, 2009 9:20 AM  Follow-up for Phone Call        Spoke with daughter, appt made for Wed 10-25-09 at 10:30 with me in the [facility]. Daughter aware and agrees with plan. Gypsy Balsam RN BSN  October 23, 2009 9:52 AM

## 2010-11-20 NOTE — Progress Notes (Signed)
Summary: PT DAUGHTER HAVE QUESTIONS  Phone Note Call from Patient Call back at [phone]   Caller: Daughter/TAMMY Summary of Call: PT DAUGHTER HAVE FEW QUESTION DID NOT GIVE ANY INFORMATION Initial call taken by: Judie Grieve,  January 01, 2010 9:48 AM  Follow-up for Phone Call        Pt had an episode after eating on Friday where he passed out after he ate.  Has appt with [facility] in [place] and will have industry check device there to evaluate. Gypsy Balsam RN BSN  January 01, 2010 11:12 AM

## 2010-11-20 NOTE — Letter (Signed)
Summary: [facility] D/C DR. Wende Crease  [facility] D[facility]C DR. Wende Crease   Imported By: Zachary George 01/02/2010 19:47:01  _____________________________________________________________________  External Attachment:    Type:   Image     Comment:   External Document

## 2010-11-20 NOTE — Progress Notes (Signed)
Summary: Office Visit/ [facility] OFFICE VISIT  Office Visit/ [facility] OFFICE VISIT   Imported By: Dorise Hiss 08/03/2010 09:31:14  _____________________________________________________________________  External Attachment:    Type:   Image     Comment:   External Document

## 2010-11-20 NOTE — Assessment & Plan Note (Signed)
Summary: 3 MO FU PER FEB REMINDER-SRS   Visit Type:  Follow-up Primary Provider:  Dr. Wyvonnia Lora  CC:  follow-up visit.  History of Present Illness: the patient is a 70 year old male with a history of recurrent syncope.  The patient is status post implantable Loop monitor.  The patient also has a history of acute systolic heart failure with ischemic cardiomyopathy ejection fraction 40 to 45%.  He has a history of drug alluding stent placement x 24 July 2009.  The patient also has history of anemia and thrombocytopenia.  The patient is status post EP study for recurrent syncope which was negative for inducible ventricular tachycardia.  The patient reports again episodes of syncope.  He was having dinner in town and when ready to leave he felt he was getting dizzy and lost consciousness for approximately 2 minutes.  He slumped over.  According to bystanders broke out in a cold sweat and a slowly came around the patient now reports a total of 4 episodes of syncope.  Of note is also that he has end-stage renal disease and is on hemodialysis.  This is the second episode since hemodialysis.  The patient denies any palpitations chest pain or shortness of breath.  His loop monitor was interrogated and showed no evidence of tachyarrhythmias.  There was bradycardia noted with rare episodes of heart rate below 40 beats/min but not associated with this particular event.  Preventive Screening-Counseling & Management  Alcohol-Tobacco     Smoking Status: quit > 6 months  Comments: Quit smoking 20+ yrs after smoking for about 20 yrs. Mostly smoked cigars  Clinical Review Panels:  Echocardiogram Echocardiogram   Transthoracic Echocardiography  Study Conclusions    1. Left ventricle: The cavity size was normal. There was mild      concentric hypertrophy. Systolic function was moderately reduced.      The estimated ejection fraction was in the range of 35% to 40%.      There is akinesis of the  apical myocardium. Doppler parameters      are consistent with abnormal left ventricular relaxation (grade 1      diastolic dysfunction).   2. Mitral valve: Calcified annulus.   3. Left atrium: The atrium was moderately dilated.   4. Pericardium, extracardiac: A small pericardial effusion was      identified posterior to the heart.   Transthoracic echocardiography. M-mode, complete 2D, spectral   Doppler, and color Doppler. Patient status: Inpatient. Location:   Echo laboratory.  Olga Millers, MD, [facility] (08/01/2009)  Cardiac Imaging Cardiac Cath Findings                           CARDIAC CATHETERIZATION      PROCEDURE:  PTCA and stenting of the right PDA, right PL-1, right PL-2,   and distal right coronary artery, and Perclose of the right femoral   artery.        FINAL CONCLUSION:  Successful stenting of the PDA, PL-1, PL-2, and   distal right coronary artery as described above.      RECOMMENDATIONS:  The patient should remain on dual-antiplatelet therapy   with aspirin and Plavix for a bare minimum of 12 months.  If he   tolerates, he should stay on lifelong.               Veverly Fells. Excell Seltzer, MD  (08/02/2009)  EP Studies EP Study Results CONCLUSIONS: 1. Sinus rhythm upon presentation. 2. No  infra-Hisian block. 3. No inducible supraventricular tachycardia, ventricular tachycardia,     or ventricular fibrillation. 4. No early apparent complications. 5. Implantable loop recorder placed as reported separately. (07/30/2009)    Current Medications (verified): 1)  Amlodipine Besylate 5 Mg Tabs (Amlodipine Besylate) .... Take 1 Tablet By Mouth Once A Day 2)  Metoprolol Succinate 50 Mg Xr24h-Tab (Metoprolol Succinate) .... Take 1 Tablet By Mouth Once A Day 3)  Simvastatin 40 Mg Tabs (Simvastatin) .... Take 1 Tablet By Mouth Once A Day 4)  Aspirin 325 Mg Tabs (Aspirin) .... Take 1 Tablet By Mouth Once A Day 5)  Calcium Acetate 667 Mg Caps (Calcium Acetate (Phos Binder)) ....  Take 1 Tablet By Mouth Three Times A Day With Meals 6)  Humulin N 100 Unit/ml Susp (Insulin Isophane Human) .... Use As Directed 7)  Levothroid 175 Mcg Tabs (Levothyroxine Sodium) .... Take 1 Tablet By Mouth Once A Day 8)  Plavix 75 Mg Tabs (Clopidogrel Bisulfate) .... Take 1 Tablet By Mouth Once A Day 9)  Dialyvite 3000 3 Mg Tabs (B Complex-C-Biotin-E-Min-Fa) .... Take 1 Tablet By Mouth Once A Day 10)  Lorazepam 0.5 Mg Tabs (Lorazepam) .... Take 1 Tablet By Mouth Two Times A Day As Needed 11)  Silvadene 1 % Crea (Silver Sulfadiazine) .... Apply To Heel As Directed  Allergies: No Known Drug Allergies  Comments:  Nurse/Medical Assistant: The patient's medications were reviewed with the patient and were updated in the Medication List. Pt brought a list of medications to office visit.  Cyril Loosen, RN, BSN (January 03, 2010 10:04 AM)  Past History:  Past Surgical History: Last updated: 08/28/2009 Cataract Extraction Cholecystectomy  Family History: Last updated: 08/28/2009 Positive for Diabetes Mellitus and Hypertension with no known CAD  Social History: Last updated: 08/28/2009 Widowed  Alcohol Use - no Retired (worked Haematologist a Arboriculturist) smoked cigars but quit smoking in 1987 after smoking for 20 yrs.  Risk Factors: Smoking Status: quit > 6 months (01/03/2010)  Past Medical History: Recurrent unexplained syncope noniducible EP study ILR placed  coronary artery disease  a. Abnormal perfusion imaging study with multipe, large fixed defects, EF 39%  b. EF 50-55 % with apical akinesis by 2 D Echo Status post coronary intervention with drug-eluting stent placement x 24 July 2009 ejection fraction 40 to 45%. Ventricular ectopy a. no documented nonsustained ventricular tachycardia Multiple cardiac risk factors a. Insulin dependent Diabetes mellitus b. hypertension c. History of Tobacco d. Age Chronic Renal Failure, on hemodialysis. Chronic  Anemia Hypothyroidism status post stent to the LAD 07/30/2009 a. 50-69% right ICA stenois on this admission status post septicemia with Serratia and infected left arm fistula.  Status post removal left arm fistula with new right arm fistula.  Hemodialysis through a Vas-Cath catheter  Past Cardiac History:  [facility] Hospitalizations: FINAL DIAGNOSES: 1. Admitted to [facility] with syncope.     a.     No dysrhythmia on telemetry at [facility] on October      8 and October 9. 2. Myoview study done at [facility].     a.     Multiple large fixed defects.     b.     Ejection fraction 39%. 3. Echocardiogram at [facility] on October 7, ejection fraction of 50-     55%. 4. Stage IV chronic kidney disease.     a.     Secondary to diabetes and hypertension.     b.     Impending hemodialysis.  c.     Vein mapping faxed to [facility] at [phone] on October 15. 5. Nephrology consult this admission.     a.     Suggesting we hold ramipril and Diovan.     b.     Ordered vein mapping study. 6. Left heart catheterization on July 31, 2009, a diagnostic study.     a.     The patient had a 99% stenosis in the left anterior      descending after the diagonal.     b.     Severe distal right coronary artery/posterior descending      artery/posterolateral branches 1 and 2 disease 90% stenoses all      around. 7. Followup echocardiogram on August 01, 2009, akinesis of the apex     (left anterior descending not a candidate for percutaneous coronary     intervention, but the right coronary artery is a candidate for     percutaneous coronary intervention). 8. Percutaneous transluminal coronary angioplasty stenting x4 on     August 02, 2009.  Stents were placed in the posterior descending     artery, the posterolateral branches 1and 2 and the distal right     coronary artery, 4 drug-eluting stents.     a.     Aspirin and Plavix for 12 months longer if tolerated. 9. Electrophysiology study on August 04, 2009.     a.     Non-inducible ventricular tachycardia/ventricular      fibrillation.     b.     Loop implanted a Medtronic Reveal, the patient discharging      on August 04, 2009. 07/30/2009 - 09/04/2009:  Angina (syncope)  Social History: Smoking Status:  quit > 6 months  Review of Systems       The patient complains of fatigue and fainting.  The patient denies malaise, fever, weight gain/loss, vision loss, decreased hearing, hoarseness, chest pain, palpitations, shortness of breath, prolonged cough, wheezing, sleep apnea, coughing up blood, abdominal pain, blood in stool, nausea, vomiting, diarrhea, heartburn, incontinence, blood in urine, muscle weakness, joint pain, leg swelling, rash, skin lesions, headache, dizziness, depression, anxiety, enlarged lymph nodes, easy bruising or bleeding, and environmental allergies.    Vital Signs:  Patient profile:   71 year old male Height:      72 inches Weight:      168.75 pounds Pulse rate:   61 / minute Pulse (ortho):   65 / minute BP sitting:   129 / 80  (left arm) BP standing:   148 / 66 Cuff size:   regular  Vitals Entered By: Cyril Loosen, RN, BSN (January 03, 2010 9:58 AM)  Serial Vital Signs/Assessments:  Time      Position  BP       Pulse  Resp  Temp     By 11:14 AM  Lying [place]  118/62   52                    [address], LPN 16:07 AM  Sitting   124/66   57                    [address] LPN 37:10 AM  Standing  148/66   65                    [place], LPN  Comments: 62:69 AM 2 min.  142/67  61 5 min.  145/72  60 Only  felt slightly lightheaded with lying to sitting position.   No changes on all other.   By: Hoover Brunette, LPN   CC: follow-up visit Comments Pt states he had a syncopal episode on Saturday while eating out. He states he was with his son and was out for about 2 mins. He states his syncopal episodes always occur after eating and per his dgt it occurs about every 2 1/2-3 months.    Physical  Exam  Additional Exam:  General: Well-developed, well-nourished in no distress head: Normocephalic and atraumatic eyes PERRLA/EOMI intact, conjunctiva and lids normal nose: No deformity or lesions mouth normal dentition, normal posterior pharynx neck: Supple, no JVD.  No masses, thyromegaly or abnormal cervical nodes Chest: Vas-Cath catheter in place, lungs: Normal breath sounds bilaterally without wheezing.  Normal percussion heart: regular rate and rhythm with normal S1 and S2, no S3 or S4.  PMI is normal.  No pathological murmurs abdomen: Normal bowel sounds, abdomen is soft and nontender without masses, organomegaly or hernias noted.  No hepatosplenomegaly musculoskeletal: Back normal, normal gait muscle strength and tone normal pulsus: Pulse is normal in all 4 extremities Extremities: No peripheral pitting edema, status post removal left arm fistula and new right arm fistula. neurologic: Alert and oriented x 3 skin: Intact without lesions or rashes cervical nodes: No significant adenopathy psychologic: Normal affect     ILR Following MD Hillis Range, MD DOI:  08/04/2009 Vendor:  Medtronic     Model Number:  [id number]     Serial Number [id number]        Impression & Recommendations:  Problem # 1:  CORONARY ARTERY DISEASE, S/P PTCA (ICD-414.9) the patient reports no recurrent chest pain.  Stable from a cardiovascular perspective. His updated medication list for this problem includes:    Amlodipine Besylate 5 Mg Tabs (Amlodipine besylate) .Marland Kitchen... Take 1 tablet by mouth once a day    Metoprolol Succinate 50 Mg Xr24h-tab (Metoprolol succinate) .Marland Kitchen... Take 1 tablet by mouth once a day    Aspirin 325 Mg Tabs (Aspirin) .Marland Kitchen... Take 1 tablet by mouth once a day    Plavix 75 Mg Tabs (Clopidogrel bisulfate) .Marland Kitchen... Take 1 tablet by mouth once a day  Problem # 2:  SYNCOPE AND COLLAPSE (ICD-780.2) the patient had another episode of syncope.  His loop monitor was interrogated. there was no  evidence of significant bradyarrhythmias or tachyarrhythmias.  Instructed the patient that he cannot drive.  It appears that his episodes are due to vasovagal syncope.  Instructed the patient immediately lie down as soon as he notes symptoms of dizziness.  Will continue to further monitor the patient for now. His updated medication list for this problem includes:    Amlodipine Besylate 5 Mg Tabs (Amlodipine besylate) .Marland Kitchen... Take 1 tablet by mouth once a day    Metoprolol Succinate 50 Mg Xr24h-tab (Metoprolol succinate) .Marland Kitchen... Take 1 tablet by mouth once a day    Aspirin 325 Mg Tabs (Aspirin) .Marland Kitchen... Take 1 tablet by mouth once a day    Plavix 75 Mg Tabs (Clopidogrel bisulfate) .Marland Kitchen... Take 1 tablet by mouth once a day  Problem # 3:  LEFT VENTRICULAR FUNCTION, DECREASED (ICD-429.2) the patient is status post EP study.  He has LV dysfunction with most recent ejection fraction of 40 to 45% but currently is not in heart failure.  Problem # 4:  HTN CKD UNSPEC W/CKD STAGE V/ESRD (ICD-403.91) patient is on hemodialysis. currently being dialyzed through a Vas-Cath catheter, awaiting dialysis through his  right arm fistula. His updated medication list for this problem includes:    Amlodipine Besylate 5 Mg Tabs (Amlodipine besylate) .Marland Kitchen... Take 1 tablet by mouth once a day    Metoprolol Succinate 50 Mg Xr24h-tab (Metoprolol succinate) .Marland Kitchen... Take 1 tablet by mouth once a day    Aspirin 325 Mg Tabs (Aspirin) .Marland Kitchen... Take 1 tablet by mouth once a day  Patient Instructions: 1)  Your physician recommends that you continue on your current medications as directed. Please refer to the Current Medication list given to you today. 2)  Follow up in  6 months.

## 2010-11-20 NOTE — Cardiovascular Report (Signed)
Summary: Office Visit   Office Visit   Imported By: Roderic Ovens 11/02/2009 14:09:35  _____________________________________________________________________  External Attachment:    Type:   Image     Comment:   External Document

## 2010-11-22 NOTE — Letter (Signed)
Summary: Appointment- Rescheduled  [facility] [address]   [place], [place] [address]   Phone: [phone]  Fax: [phone]     November 01, 2010 MRN: [id number]     [facility] [address] [place], [place]  [address]     Dear Matthew Espinoza,   Due to a change in our office schedule, your appointment on  November 28, 2010  at 1:45 pm  with Dr. Johney Frame must be changed.   Your new appointment will be December 06, 2010 at 2:00 pm.  We look forward to participating in your health care needs.  Sincerely,  Glass blower/designer

## 2010-12-06 ENCOUNTER — Encounter (INDEPENDENT_AMBULATORY_CARE_PROVIDER_SITE_OTHER): Payer: Medicare Other | Admitting: Internal Medicine

## 2010-12-06 ENCOUNTER — Encounter: Payer: Self-pay | Admitting: Internal Medicine

## 2010-12-06 DIAGNOSIS — R55 Syncope and collapse: Secondary | ICD-10-CM

## 2010-12-12 NOTE — Assessment & Plan Note (Signed)
Summary: F/U PER NOV REM...PT [facility] DUE TO EPIC LETTER MAILED TO NO...   Visit Type:  Pacemaker check Primary Provider:  Dr. Wyvonnia Lora   History of Present Illness: The patient presents today for routine electrophysiology followup. He reports doing very well since last being seen in our clinic.  He has had no further syncope.  He reports that his blood pressure has been controlled, though occasionally he has hypotension during HD.  The patient denies symptoms of palpitations, chest pain, shortness of breath, orthopnea, PND, dizziness, presyncope, syncope, or neurologic sequela. The patient is tolerating medications without difficulties and is otherwise without complaint today.   Preventive Screening-Counseling & Management  Alcohol-Tobacco     Smoking Status: quit  Current Medications (verified): 1)  Metoprolol Succinate 50 Mg Xr24h-Tab (Metoprolol Succinate) .... Take 1 Tablet By Mouth Once A Day 2)  Simvastatin 40 Mg Tabs (Simvastatin) .... Take 1 Tablet By Mouth Once A Day 3)  Aspirin 325 Mg Tabs (Aspirin) .... Take 1 Tablet By Mouth Once A Day 4)  Calcium Acetate 667 Mg Caps (Calcium Acetate (Phos Binder)) .... Take 1 Tablet By Mouth Three Times A Day With Meals 5)  Humulin N 100 Unit/ml Susp (Insulin Isophane Human) .... Use As Directed 6)  Levothroid 175 Mcg Tabs (Levothyroxine Sodium) .... Take 1 Tablet By Mouth Once A Day 7)  Plavix 75 Mg Tabs (Clopidogrel Bisulfate) .... Take 1 Tablet By Mouth Once A Day 8)  Dialyvite 3000 3 Mg Tabs (B Complex-C-Biotin-E-Min-Fa) .... Take 1 Tablet By Mouth Once A Day 9)  Miralax  Powd (Polyethylene Glycol 3350) .... Use As Directed As Needed  Allergies (verified): No Known Drug Allergies  Comments:  Nurse/Medical Assistant: The patient's medications and allergies were reviewed with the patient and were updated in the Medication and Allergy Lists. Tammi Romine CMA (December 06, 2010 1:52 PM)  Past History:  Past Medical  History: Reviewed history from 08/03/2010 and no changes required. Recurrent unexplained syncope noniducible EP study ILR placed  coronary artery disease  a. Abnormal perfusion imaging study with multipe, large fixed defects, EF 39%  b. EF 50-55 % with apical akinesis by 2 D Echo Status post coronary intervention with drug-eluting stent placement x 24 July 2009 ejection fraction 40 to 45%. Ventricular ectopy a. no documented nonsustained ventricular tachycardia Multiple cardiac risk factors a. Insulin dependent Diabetes mellitus b. hypertension c. History of Tobacco d. Age Chronic Renal Failure, on hemodialysis. Chronic Anemia Hypothyroidism status post stent to the LAD 07/30/2009 a. 50-69% right ICA stenois on this admission status post septicemia with Serratia and infected left arm fistula.  Status post removal left arm fistula with new right arm fistula.  Hemodialysis through a Vas-Cath catheter ABIs March 2011 bilateral lower sugar to loosen disease primarily infrapopliteal moderate in severity MRA recommended if fails conservative therapy.  Past Surgical History: Reviewed history from 08/28/2009 and no changes required. Cataract Extraction Cholecystectomy  Past Cardiac History:  [facility] Hospitalizations: FINAL DIAGNOSES: 1. Admitted to [facility] with syncope.     a.     No dysrhythmia on telemetry at [facility] on October      8 and October 9. 2. Myoview study done at [facility].     a.     Multiple large fixed defects.     b.     Ejection fraction 39%. 3. Echocardiogram at [facility] on October 7, ejection fraction of 50-     55%. 4. Stage IV chronic kidney disease.  a.     Secondary to diabetes and hypertension.     b.     Impending hemodialysis.     c.     Vein mapping faxed to [facility] at [phone] on October 15. 5. Nephrology consult this admission.     a.     Suggesting we hold ramipril and Diovan.     b.     Ordered vein mapping study. 6. Left  heart catheterization on July 31, 2009, a diagnostic study.     a.     The patient had a 99% stenosis in the left anterior      descending after the diagonal.     b.     Severe distal right coronary artery/posterior descending      artery/posterolateral branches 1 and 2 disease 90% stenoses all      around. 7. Followup echocardiogram on August 01, 2009, akinesis of the apex     (left anterior descending not a candidate for percutaneous coronary     intervention, but the right coronary artery is a candidate for     percutaneous coronary intervention). 8. Percutaneous transluminal coronary angioplasty stenting x4 on     August 02, 2009.  Stents were placed in the posterior descending     artery, the posterolateral branches 1and 2 and the distal right     coronary artery, 4 drug-eluting stents.     a.     Aspirin and Plavix for 12 months longer if tolerated. 9. Electrophysiology study on August 04, 2009.     a.     Non-inducible ventricular tachycardia/ventricular      fibrillation.     b.     Loop implanted a Medtronic Reveal, the patient discharging      on August 04, 2009. 07/30/2009 - 09/04/2009:  Angina (syncope)  Social History: Widowed  Alcohol Use - no Retired (worked Haematologist a Arboriculturist) smoked cigars but quit smoking in 1987 after smoking for 20 yrs.  enjoys playing cards.  [place] fan.  Vital Signs:  Patient profile:   70 year old male Height:      72 inches Weight:      195 pounds BMI:     26.54 O2 Sat:      97 % on Room air Pulse rate:   63 / minute BP sitting:   153 / 79  (left arm) Cuff size:   regular  Vitals Entered By: Fuller Plan CMA (December 06, 2010 1:52 PM)  O2 Flow:  Room air  Physical Exam  General:  Well developed, well nourished, in no acute distress.  Wearing Gloster blue today. Head:  normocephalic and atraumatic Eyes:  PERRLA/EOM intact; conjunctiva and lids normal. Mouth:  Teeth, gums and palate normal. Oral mucosa  normal. Neck:  supple Lungs:  Clear bilaterally to auscultation and percussion. Heart:  RRR, 2/6 SEM LUSB Abdomen:  Bowel sounds positive; abdomen soft and non-tender without masses, organomegaly, or hernias noted. No hepatosplenomegaly. Msk:  Back normal, normal gait. Muscle strength and tone normal. Extremities:  No clubbing or cyanosis.  trace edema Neurologic:  Alert and oriented x 3. Skin:  Intact without lesions or rashes.    ILR Following MD Hillis Range, MD DOI:  08/04/2009 Vendor:  Medtronic     Model Number:  [id number]     Serial Number [id number]       Tachy Episodes:  0     Brady Episodes:  0 ILR Next Due 02/19/2011  Tech Comments:  Normal loop recorder check.  No changes made.  No episodes recorded. ROV in 3 months for device check. Vella Kohler  December 06, 2010 2:09 PM  MD Comments:  no episodes  Impression & Recommendations:  Problem # 1:  SYNCOPE AND COLLAPSE (ICD-780.2) no further episodes no arrhythmias by ILR  Problem # 2:  HTN CKD UNSPEC W/CKD STAGE V/ESRD (ICD-403.91) BP today is elevated, though he reports good control during HD.  Occasionally he has to terminate sessions due to hypotension no changes today

## 2010-12-27 NOTE — Cardiovascular Report (Signed)
Summary: [facility]  [facility]   Imported By: Dorise Hiss 12/19/2010 16:59:38  _____________________________________________________________________  External Attachment:    Type:   Image     Comment:   External Document

## 2011-01-06 LAB — POCT I-STAT 4, (NA,K, GLUC, HGB,HCT)
HCT: 41 % (ref 39.0–52.0)
Hemoglobin: 13.9 g/dL (ref 13.0–17.0)

## 2011-01-06 LAB — GLUCOSE, CAPILLARY
Glucose-Capillary: 150 mg/dL — ABNORMAL HIGH (ref 70–99)
Glucose-Capillary: 163 mg/dL — ABNORMAL HIGH (ref 70–99)

## 2011-01-21 LAB — WOUND CULTURE: Culture: NO GROWTH

## 2011-01-21 LAB — GLUCOSE, CAPILLARY
Glucose-Capillary: 148 mg/dL — ABNORMAL HIGH (ref 70–99)
Glucose-Capillary: 169 mg/dL — ABNORMAL HIGH (ref 70–99)
Glucose-Capillary: 176 mg/dL — ABNORMAL HIGH (ref 70–99)

## 2011-01-21 LAB — POCT I-STAT 4, (NA,K, GLUC, HGB,HCT): Potassium: 3.9 mEq/L (ref 3.5–5.1)

## 2011-01-22 LAB — GLUCOSE, CAPILLARY
Glucose-Capillary: 88 mg/dL (ref 70–99)
Glucose-Capillary: 95 mg/dL (ref 70–99)

## 2011-01-23 LAB — GLUCOSE, CAPILLARY: Glucose-Capillary: 169 mg/dL — ABNORMAL HIGH (ref 70–99)

## 2011-01-24 LAB — GLUCOSE, CAPILLARY
Glucose-Capillary: 113 mg/dL — ABNORMAL HIGH (ref 70–99)
Glucose-Capillary: 117 mg/dL — ABNORMAL HIGH (ref 70–99)
Glucose-Capillary: 123 mg/dL — ABNORMAL HIGH (ref 70–99)
Glucose-Capillary: 131 mg/dL — ABNORMAL HIGH (ref 70–99)
Glucose-Capillary: 136 mg/dL — ABNORMAL HIGH (ref 70–99)
Glucose-Capillary: 150 mg/dL — ABNORMAL HIGH (ref 70–99)
Glucose-Capillary: 156 mg/dL — ABNORMAL HIGH (ref 70–99)
Glucose-Capillary: 210 mg/dL — ABNORMAL HIGH (ref 70–99)
Glucose-Capillary: 262 mg/dL — ABNORMAL HIGH (ref 70–99)
Glucose-Capillary: 267 mg/dL — ABNORMAL HIGH (ref 70–99)
Glucose-Capillary: 286 mg/dL — ABNORMAL HIGH (ref 70–99)
Glucose-Capillary: 62 mg/dL — ABNORMAL LOW (ref 70–99)
Glucose-Capillary: 63 mg/dL — ABNORMAL LOW (ref 70–99)
Glucose-Capillary: 67 mg/dL — ABNORMAL LOW (ref 70–99)
Glucose-Capillary: 74 mg/dL (ref 70–99)
Glucose-Capillary: 75 mg/dL (ref 70–99)

## 2011-01-24 LAB — CBC
HCT: 33.1 % — ABNORMAL LOW (ref 39.0–52.0)
HCT: 34.2 % — ABNORMAL LOW (ref 39.0–52.0)
HCT: 37.2 % — ABNORMAL LOW (ref 39.0–52.0)
Hemoglobin: 11.3 g/dL — ABNORMAL LOW (ref 13.0–17.0)
Hemoglobin: 11.7 g/dL — ABNORMAL LOW (ref 13.0–17.0)
Hemoglobin: 12.6 g/dL — ABNORMAL LOW (ref 13.0–17.0)
MCHC: 33.8 g/dL (ref 30.0–36.0)
MCHC: 34 g/dL (ref 30.0–36.0)
MCHC: 34.3 g/dL (ref 30.0–36.0)
MCV: 89.8 fL (ref 78.0–100.0)
MCV: 90.1 fL (ref 78.0–100.0)
MCV: 90.7 fL (ref 78.0–100.0)
MCV: 91.3 fL (ref 78.0–100.0)
Platelets: 164 10*3/uL (ref 150–400)
Platelets: 172 10*3/uL (ref 150–400)
RBC: 3.56 MIL/uL — ABNORMAL LOW (ref 4.22–5.81)
RBC: 3.77 MIL/uL — ABNORMAL LOW (ref 4.22–5.81)
RDW: 13.2 % (ref 11.5–15.5)
RDW: 13.5 % (ref 11.5–15.5)
WBC: 8.4 10*3/uL (ref 4.0–10.5)

## 2011-01-24 LAB — BASIC METABOLIC PANEL
BUN: 90 mg/dL — ABNORMAL HIGH (ref 6–23)
BUN: 94 mg/dL — ABNORMAL HIGH (ref 6–23)
CO2: 18 mEq/L — ABNORMAL LOW (ref 19–32)
CO2: 20 mEq/L (ref 19–32)
Calcium: 8.5 mg/dL (ref 8.4–10.5)
Calcium: 9.1 mg/dL (ref 8.4–10.5)
Chloride: 107 mEq/L (ref 96–112)
Chloride: 109 mEq/L (ref 96–112)
Chloride: 111 mEq/L (ref 96–112)
Creatinine, Ser: 5.52 mg/dL — ABNORMAL HIGH (ref 0.4–1.5)
GFR calc Af Amer: 13 mL/min — ABNORMAL LOW (ref >60)
GFR calc non Af Amer: 10 mL/min — ABNORMAL LOW (ref >60)
Glucose, Bld: 152 mg/dL — ABNORMAL HIGH (ref 70–99)
Glucose, Bld: 203 mg/dL — ABNORMAL HIGH (ref 70–99)
Glucose, Bld: 236 mg/dL — ABNORMAL HIGH (ref 70–99)
Potassium: 4.2 mEq/L (ref 3.5–5.1)
Sodium: 138 mEq/L (ref 135–145)
Sodium: 139 mEq/L (ref 135–145)

## 2011-01-24 LAB — POCT I-STAT 3, ART BLOOD GAS (G3+)
Acid-base deficit: 6 mmol/L — ABNORMAL HIGH (ref 0.0–2.0)
Bicarbonate: 17.9 mEq/L — ABNORMAL LOW (ref 20.0–24.0)
pCO2 arterial: 30 mmHg — ABNORMAL LOW (ref 35.0–45.0)
pO2, Arterial: 71 mmHg — ABNORMAL LOW (ref 80.0–100.0)

## 2011-01-24 LAB — RENAL FUNCTION PANEL
CO2: 19 mEq/L (ref 19–32)
CO2: 20 mEq/L (ref 19–32)
Calcium: 8.5 mg/dL (ref 8.4–10.5)
Chloride: 109 mEq/L (ref 96–112)
Chloride: 109 mEq/L (ref 96–112)
Creatinine, Ser: 5.79 mg/dL — ABNORMAL HIGH (ref 0.4–1.5)
Creatinine, Ser: 6.17 mg/dL — ABNORMAL HIGH (ref 0.4–1.5)
GFR calc Af Amer: 11 mL/min — ABNORMAL LOW (ref >60)
GFR calc non Af Amer: 9 mL/min — ABNORMAL LOW (ref >60)
Glucose, Bld: 227 mg/dL — ABNORMAL HIGH (ref 70–99)
Glucose, Bld: 293 mg/dL — ABNORMAL HIGH (ref 70–99)

## 2011-01-24 LAB — POCT I-STAT 3, VENOUS BLOOD GAS (G3P V)
Acid-base deficit: 6 mmol/L — ABNORMAL HIGH (ref 0.0–2.0)
Bicarbonate: 19.1 mEq/L — ABNORMAL LOW (ref 20.0–24.0)
pCO2, Ven: 34.3 mmHg — ABNORMAL LOW (ref 45.0–50.0)
pO2, Ven: 33 mmHg (ref 30.0–45.0)

## 2011-01-24 LAB — PROTIME-INR: INR: 1.07 (ref 0.00–1.49)

## 2011-01-31 LAB — GLUCOSE, CAPILLARY
Glucose-Capillary: 150 mg/dL — ABNORMAL HIGH (ref 70–99)
Glucose-Capillary: 213 mg/dL — ABNORMAL HIGH (ref 70–99)
Glucose-Capillary: 214 mg/dL — ABNORMAL HIGH (ref 70–99)

## 2011-01-31 LAB — BASIC METABOLIC PANEL
BUN: 51 mg/dL — ABNORMAL HIGH (ref 6–23)
Creatinine, Ser: 4.29 mg/dL — ABNORMAL HIGH (ref 0.4–1.5)
GFR calc Af Amer: 17 mL/min — ABNORMAL LOW (ref >60)
GFR calc non Af Amer: 14 mL/min — ABNORMAL LOW (ref >60)
Potassium: 5.5 mEq/L — ABNORMAL HIGH (ref 3.5–5.1)

## 2011-01-31 LAB — CBC
Platelets: 134 10*3/uL — ABNORMAL LOW (ref 150–400)
RBC: 3.62 MIL/uL — ABNORMAL LOW (ref 4.22–5.81)
WBC: 7.6 10*3/uL (ref 4.0–10.5)

## 2011-03-05 NOTE — Assessment & Plan Note (Signed)
OFFICE VISIT   Matthew Espinoza, Matthew Espinoza  DOB:  12/02/1940                                       08/28/2009  ONGEX#:[id number]   REASON FOR VISIT:  New access.   CHIEF COMPLAINT:  None.   HISTORY:  This is a 70 year old gentleman with chronic kidney disease  not yet on dialysis who I am seeing at the request of Dr. Kristian Covey for  new access.  The patient recently had multiple coronary stents placed by  Dr. Edwyna Shell for chest pain.  He has a history of having had an old MI.  His most recent creatinine is 6.1.  He has known about his end-stage  renal disease for approximately 2-3 years.  His kidney disease is  secondary to diabetes and hypertension.  His Diovan and Ramipril have  recently been held.  He comes today with vein mapping.   As stated above his coronary artery disease has recently been intervened  on.  He had percutaneous transluminal angioplasty and stenting times 4  on August 02, 2009.  Four drug-eluting stents were placed .  Defibrillator was also placed.  The patient has  medication controlled  hypertension.  He  also has medication controlled cholesterol.   REVIEW OF SYSTEMS:  Cardiac:  Positive for shortness of breath.  GI:  Positive for reflux and diarrhea.  GU:  Positive for kidney disease.  Vascular:  He has pain in the legs with walking.  Orthopedic:  Positive  for joint pain.  Psych:  Positive for nervousness.  ENT:  Positive for  change  in eyesight.  All other systems negative as documented in the  encounter form.   PAST MEDICAL HISTORY:  Diabetes, hypertension, hypothyroid,  hypercholesterolemia, history of MI, diabetes.   FAMILY HISTORY:  Negative for cardiovascular disease at an early age.   SOCIAL HISTORY:  Single with 2 children.  He is disabled.  He does not  smoke.  He has a history of smoking but quit 23 years ago.  He does not  drink alcohol.   ALLERGIES:  None.   MEDICATIONS:  Please see medical record.   PHYSICAL  EXAMINATION:  Vital signs:  Heart rate 67, blood pressure  165/80,  temperature is 98.0.  General:  Well-appearing  in no distress.  HEENT:  Normocephalic, atraumatic.  Pupils equal.  Sclerae anicteric.  Neck:  Supple.  No JVD.  No carotid bruits.  Cardiovascular:  Regular  rhythm.  No murmurs.  Abdomen:  Soft, nontender.  Respirations:  Unlabored.  Abdomen:  Soft, nontender.  Musculoskeletal:  No major  deformities.  Neurologically:  He is without focal deficits.  Skin:  Without rash.  Psych:  He is alert and oriented x 3.  Extremities:  He  has palpable left radial and left brachial pulse.   DIAGNOSTIC STUDIES:  The patient comes today with vein mapping for which  he has been independently  reviewed.  He has small cephalic vein below  the antecubital crease.  He has adequate cephalic vein at the level of  the antecubital crease.   ASSESSMENT/PLAN:  End-stage renal disease needing access.   PLAN:  The patient will be scheduled for left upper arm AV fistula. I  discussed the risks of nonmaturity and need for future interventions  with the patient today.  He understands these and wishes to proceed.  We  will do this without stopping his Plavix or his aspirin.  I told him he  has a slight increased risk for bleeding but I do not think this will be  an issue.  His surgery has been scheduled for Tuesday, November 16.   Jorge Ny, MD  Electronically Signed   VWB/MEDQ  D:  08/28/2009  T:  08/29/2009  Job:  [id number]   cc:   Jorja Loa, M.D.

## 2011-03-05 NOTE — Assessment & Plan Note (Signed)
OFFICE VISIT   Matthew Espinoza, Matthew Espinoza  DOB:  1941/07/15                                       09/19/2009  QMVHQ#:[id number]   The patient had an AV fistula created by Dr. Myra Gianotti of the left upper  extremity on November 16.  He has been having numbness in the left hand  since that time but no pain.  He states it is very uncomfortable.  He  has not had any weakness in the hand but he states that it has not  improved any.  He is not on dialysis yet.   PHYSICAL EXAMINATION:  The left hand is quite pale compared to the  right.  There is sluggish capillary refill.  The color returns with  compression of the left upper arm fistula.  Resting blood pressure on  the left is 68 and with the fistula compressed it increased to 170.   He does have a significant steal in the left upper arm fistula.  I hate  to sacrifice the fistula which is working nicely so we will try to  revise it by decreasing the inflow.  We have scheduled him for revision  of this fistula Friday December 3 at [facility] as an outpatient.   Quita Skye Hart [place], M.D.  Electronically Signed   JDL/MEDQ  D:  09/19/2009  T:  09/20/2009  Job:  [id number]   cc:   Jorja Loa, M.D.

## 2011-03-05 NOTE — Assessment & Plan Note (Signed)
OFFICE VISIT   Matthew Espinoza, Matthew Espinoza  DOB:  13-May-1941                                       12/15/2009  EAVWU#:[id number]   DATE OF SURGERY:  11/13/2009   CHIEF COMPLAINT:  End-stage renal disease, followup right upper arm AV  fistula.   HISTORY OF PRESENT ILLNESS:  The patient is a 70 year old gentleman who  had a right upper arm AV fistula placed on 11/13/2009.  He has been  doing well.  He has had no numbness or tingling or coolness of his right  hand.  He is able to use his right hand without difficulty.  He has no  signs of steal.  He is dialyzing Tuesday, Thursday, Saturday through a  catheter.   PAST MEDICAL HISTORY:  Significant for end-stage renal disease,  diabetes, hypertension, hypothyroidism which are all controlled with  medications.   PHYSICAL EXAMINATION:  GENERAL:  This is a well-developed, well-  nourished gentleman in no acute distress.  VITAL SIGNS:  Heart rate is 63, saturations were 96%, respiratory rate  was 12.  EXTREMITIES:  He had an excellent thrill and bruit in the right upper  arm AV fistula from the antecubital space to the mid upper arm.  The  vein was easily palpated and was maturing well.  He had 2+ radial  pulses.  Hand was warm and pink.  He had good motion and sensation.   ASSESSMENT:  Four weeks status post right upper arm AV fistula which is  maturing well with a good thrill and bruit.  He will follow up as  needed.  We should not stick this access for at least 2 more months.   Della Goo, PA-C   Larina Earthly, M.D.  Electronically Signed   RR/MEDQ  D:  12/15/2009  T:  12/15/2009  Job:  [id number]

## 2011-03-05 NOTE — Procedures (Signed)
CEPHALIC VEIN MAPPING   INDICATION:  Preop right vein mapping.   HISTORY:  Failed left upper extremity AVF.   EXAM:  The right cephalic vein is compressible.   Diameter measurements range from 0.27 cm to 0.65 cm.   The left cephalic vein was not evaluated.   See attached worksheet for all measurements.   IMPRESSION:  1. Patent right cephalic vein which is possibly of acceptable diameter      for use as a dialysis access site in the brachium. Thrombus noted      throughout forearm, right cephalic vein.  2. Patent right basilic vein in the brachium, which is unable to be      visualized distal to the antecubital fossa.   ___________________________________________  Janetta Hora. Fields, MD   AS/MEDQ  D:  11/01/2009  T:  11/01/2009  Job:  [id number]

## 2011-03-05 NOTE — Assessment & Plan Note (Signed)
OFFICE VISIT   Matthew Espinoza, Matthew Espinoza  DOB:  Sep 04, 1941                                       11/01/2009  ZOXWR#:[id number]   The patient returns for followup today.  He previously had a left  brachiocephalic AV fistula placed by Dr. Myra Gianotti on 09/05/2009.  He had  a steal develop in the left arm.  He then had a banding procedure of the  left upper arm fistula on 09/22/2009.  This subsequently became infected  requiring removal of the fistula.  He presents today for followup of his  arm incision and also for consideration of placement of a new  hemodialysis access.   He is right-handed.  He currently dialyzes on Tuesday, Thursday and  Saturday via right-sided catheter.  He states that the numbness and  tingling in his left hand is significantly improved and continuing to  improve daily.  He has no numbness or tingling in the right hand.   PHYSICAL EXAM:  Temperature is 97.6, blood pressure 1583/78 in the right  arm, heart rate 66 and regular.  Upper extremities he has 2+ brachial  and radial pulses bilaterally.  Incision in the left antecubital area is  well-healed.  Sutures were removed today.   He had a vein mapping ultrasound of his right upper extremity today  which shows that his right forearm cephalic vein is thrombosed.  The  upper arm cephalic vein, however, was between 34 and 39 mm in diameter.   I had a discussion with the patient today that he should be a candidate  for a right brachiocephalic AV fistula although he would still have a 5%-  10% chance of developing a steal in the right arm.  Hopefully we will  eliminate this by making his arteriotomy small.  He will continue to use  his catheter in the meanwhile.  Risks, benefits, possible complications  and procedure details of the procedure were explained to the patient  today including but not limited to infection, bleeding, nonmaturation of  the fistula and steal.     Janetta Hora. Fields, MD  Electronically Signed   CEF/MEDQ  D:  11/01/2009  T:  11/02/2009  Job:  [id number]

## 2011-03-05 NOTE — Op Note (Signed)
NAME:  Matthew Espinoza, Matthew Espinoza NO.:  [id number]   MEDICAL RECORD NO.:  [id number]          PATIENT TYPE:  OIB   LOCATION:  5118                         FACILITY:  [facility]   PHYSICIAN:  Beulah Gandy. Ashley Royalty, M.D. DATE OF BIRTH:  1941-01-09   DATE OF PROCEDURE:  01/03/2009  DATE OF DISCHARGE:  01/04/2009                               OPERATIVE REPORT   ADMISSION DIAGNOSES:  Vitreous hemorrhage, proliferative diabetic  retinopathy, posterior vitreous membranes, right eye.   PROCEDURES:  Pars plana vitrectomy with retinal photocoagulation,  membrane peel, gas-fluid exchange in the right eye.   SURGEON:  Beulah Gandy. Ashley Royalty, MD   ASSISTANT:  Rosalie Doctor, MA   ANESTHESIA:  General.   DETAILS:  Usual prep and drape, 25-gauge trocar was placed at 8, 10, and  2 o'clock, infusion at 8 o'clock.  Provisc placed on the corneal surface  and the Biom viewing system was moved into place.  Pars plana vitrectomy  was begun just behind the cataractous lens.  Bloody vitreous were  encountered.  This was carefully removed under low suction and rapid  cutting.  The vitrectomy was carried down to the macular surface where a  preretinal membrane was seen.  The lighted pick and the 25-gauge forceps  were used to engage this membrane and peel it free from its attachment  to the disc and macula.  The vitrectomy was extended into the periphery  down to the vitreous base for 360 degrees.  Surface proliferation was  removed.  Scleral depression was used to gain access to the vitreous  base, which was trimmed.  The endolaser was positioned in the eye, 516  burns were placed around the retinal periphery.  The power of 1000  milliwatts, 1000 microns each, and 0.1 seconds each.  A gas-fluid  exchange to 30% was then carried out with sterile room air.  The  instruments were removed from the eye and the trocar was removed from  the eye.  Polymyxin and gentamicin were irrigated into Tenon space.  Atropine  solution was applied.  Decadron 10 mg was injected into the  lower subconjunctival space.  Marcaine was injected around the globe for  postoperative pain.  Polysporin ophthalmic ointment and patch and shield  were placed.  Closing pressure was 10 with a  Barraquer tonometer.   COMPLICATIONS:  None.   DURATION:  30 minutes.      Beulah Gandy. Ashley Royalty, M.D.  Electronically Signed     JDM/MEDQ  D:  01/03/2009  T:  01/04/2009  Job:  [id number]

## 2011-03-05 NOTE — Procedures (Signed)
VASCULAR LAB EXAM   INDICATION:  Tingling in finger, possible steal.   HISTORY:  Diabetes:  Yes.  Cardiac:  MI.  Hypertension:  Yes.   EXAM:  AV fistula duplex.   IMPRESSION:  Significant pressure difference in the pre and post  occlusion of AV fistula.  Resting blood pressure of the right is 130 and  left is 68.  Compression of AV fistula gave a blood pressure of 138 in  the right and 170 in the left which is indication of significant steal.   ___________________________________________  Quita Skye. Hart [place], M.D.   CB/MEDQ  D:  09/19/2009  T:  09/20/2009  Job:  [id number]

## 2011-03-08 NOTE — Op Note (Signed)
[place]  Patient:    Matthew Espinoza, Matthew Espinoza                        MRN: [id number] Proc. Date: 05/07/01 Adm. Date:  [id number] Attending:  Bertrum Sol                           Operative Report  DATE OF BIRTH:  18-Aug-1941.  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  PROCEDURES:  Pars plana vitrectomy, retinal photocoagulation, left eye.  SURGEON:  Beulah Gandy. Ashley Royalty, M.D.  ASSISTANT:  Lu Duffel, C.O.A., S.A.  ANESTHESIA:  General.  DESCRIPTION OF PROCEDURE:  Usual prep and drape.  Peritomies at 10, 2, and 4 oclock.  A 4 mm angled infusion infusion port anchored in place at 4 oclock, the lighted pick and the cutter were placed at 10 and 2 oclock, respectively. The contact lens ring was anchored into place at 6 and 12 oclock.  The pars plana vitrectomy was begun just behind the crystalline lens, where blood and vitreous were encountered.  This was carefully removed under low suction and rapid cutting down to the macular surface, where blood was vacuumed.  The vitrectomy was carried out to the far periphery, and some areas of neovascularization were seen.  These were trimmed down to the surface.  With scleral depression, the blood was removed from the lower six clock hours of the eye.  The 30 degree prismatic lens was used for viewing, and depression was used to bring the areas of blood into the viewing space.  Once all the blood was removed from this area, the attention was taken superiorly, and all additional blood was removed from this area as well.  Once the vitrectomy was complete, a wash-out procedure was performed.  The Endolaser was placed in the eye, and 740 burns were placed around the retinal periphery with a power of 400 milliwatts, 1000 microns each, and 0.1 seconds each.  Instruments were removed from the eye, and 9-0 nylon was used to close the sclerotomy sites. The conjunctiva was closed  with wet-field cautery.  Polymyxin and gentamicin were irrigated into Tenons space.  Atropine solution was applied.  Decadron 10 mg was injected into the lower subconjunctival space.  Marcaine was injected around the globe for postop pain.  Polysporin, a patch and shield were placed.  The patient was awakened and taken to recovery in satisfactory condition.  Closing tension was 10 with the Barraquer tonometer. DD:  05/07/01 TD:  05/08/01 Job: [id number]

## 2011-03-14 ENCOUNTER — Ambulatory Visit: Payer: Medicare Other | Admitting: Cardiology

## 2011-03-15 ENCOUNTER — Ambulatory Visit: Payer: Medicare Other | Admitting: Cardiology

## 2011-04-25 ENCOUNTER — Other Ambulatory Visit: Payer: Self-pay | Admitting: Internal Medicine

## 2011-05-22 ENCOUNTER — Encounter: Payer: Self-pay | Admitting: Cardiology

## 2011-05-31 ENCOUNTER — Ambulatory Visit (INDEPENDENT_AMBULATORY_CARE_PROVIDER_SITE_OTHER): Payer: Medicare Other | Admitting: Cardiology

## 2011-05-31 ENCOUNTER — Encounter: Payer: Self-pay | Admitting: Cardiology

## 2011-05-31 VITALS — BP 106/65 | HR 58 | Resp 18 | Ht 72.0 in | Wt 197.8 lb

## 2011-05-31 DIAGNOSIS — N179 Acute kidney failure, unspecified: Secondary | ICD-10-CM

## 2011-05-31 DIAGNOSIS — I2510 Atherosclerotic heart disease of native coronary artery without angina pectoris: Secondary | ICD-10-CM

## 2011-05-31 DIAGNOSIS — I4891 Unspecified atrial fibrillation: Secondary | ICD-10-CM

## 2011-05-31 DIAGNOSIS — I739 Peripheral vascular disease, unspecified: Secondary | ICD-10-CM

## 2011-05-31 NOTE — Patient Instructions (Signed)
Your physician recommends that you go to the [facility] for lab work. Interrogate implantable loop device Will decide about going on Coumadin after above testing.   Follow up in 3 months

## 2011-06-01 DIAGNOSIS — I739 Peripheral vascular disease, unspecified: Secondary | ICD-10-CM | POA: Insufficient documentation

## 2011-06-01 DIAGNOSIS — I4891 Unspecified atrial fibrillation: Secondary | ICD-10-CM | POA: Insufficient documentation

## 2011-06-01 NOTE — Assessment & Plan Note (Signed)
Patient is on hemodialysis. Sometimes he has some hypotension during hemodialysis at this point I would not want to hold his beta blocker in the setting of his atrial fibrillation

## 2011-06-01 NOTE — Assessment & Plan Note (Signed)
The patient has not documented atrial fibrillation of unknown duration. We will proceed with interrogated his implantable loop monitor. The patient may require Coumadin. However is also on aspirin and Plavix of note is that he has had well over 2 years of Plavix. It would be interesting to check hisP2Y12 platelet inhibition testing to see if he is actually responded to Plavix. It's not as good medical decision easier on going forward only with aspirin Coumadin, particularly in the setting with his prior history of multiple stents to the right coronary artery. I discussed with the patient is increased risk for stroke evening the setting of aspirin Plavix and better protection will be provided by Coumadin. The patient is not a candidate for one of the newer antithrombotic agents.

## 2011-06-01 NOTE — Assessment & Plan Note (Signed)
Mild LV dysfunction but no evidence of heart failure symptoms. Continue current medical therapy

## 2011-06-01 NOTE — Assessment & Plan Note (Signed)
Prior history of infrapopliteal disease and prior foot ulcer. Patient denies claudication or any new ulcerations

## 2011-06-01 NOTE — Progress Notes (Signed)
HPI The patient is a 70 year old male with end-stage renal disease on hemodialysis, history of anemia and thrombocytopenia. He has a history of syncope which has been evaluated with an implantable loop monitor as well as an EP study, which was negative for inducible ventricular tachycardia. The patient reports no recurrent presyncope or syncope. He reports no cardiac complaints. He denies any chest pain or shortness of breath. The patient had an echocardiogram done about a year ago which showed an ejection fraction of 45-50%. He does have known coronary artery disease and received 4 stents respectively in the PDA, the posterolateral branches and the distal right coronary artery. Aspirin and Plavix is recommended for 12 months and longer if tolerated. 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 This appears to be stable. However, the patient appears to be in atrial fibrillation based on physical examination. He is asymptomatic with this. An electrocardiogram will be obtained. The patient does state that the time of his dialysis his blood pressure tends to run low. On nondialysis days his blood pressure stable.  No Known Allergies  Current Outpatient Prescriptions on File Prior to Visit  Medication Sig Dispense Refill  . aspirin 325 MG tablet Take 325 mg by mouth daily.        . Calcium Acetate 667 MG TABS Take by mouth 3 (three) times daily with meals.        . clopidogrel (PLAVIX) 75 MG tablet Take 75 mg by mouth daily.        . folic acid-vitamin b complex-vitamin c-selenium-zinc (DIALYVITE) 3 MG TABS Take 1 tablet by mouth daily.        . insulin NPH (HUMULIN N) 100 UNIT/ML injection Inject into the skin as directed.        Marland Kitchen levothyroxine (LEVOTHROID) 175 MCG tablet Take 175 mcg by mouth daily.        . metoprolol (TOPROL-XL) 50 MG 24 hr tablet Take  50 mg by mouth daily.        . polyethylene glycol powder (MIRALAX) powder Take 17 g by mouth daily.        . simvastatin (ZOCOR) 40 MG tablet TAKE 1 TABLET ONCE DAILY  30 tablet  6    Past Medical History  Diagnosis Date  . CAD (coronary artery disease)     a. abnormal perfusion imaging study with multipe, large fixed  defects, EF39% b. EF 50-55% with apical akinesis by 2D echo. Status post coronary intervention with drug-eluting stent placement x3 October 2010 EF 40-45%   . Ventricular ectopy     a. no documented nonsustained ventricular tachycardia   . Anemia     chronic  . Renal failure     chronic, on hemodialysis  . Hypothyroidism   . Diabetes mellitus   . HTN (hypertension)   . Hemodialysis adequacy testing     through a Vas-cath catheer    Past Surgical History  Procedure Date  . Cataract extraction   . Cholecystectomy     Family History  Problem Relation Age of Onset  . Diabetes Other   . Hypertension Other     History   Social History  . Marital Status: Divorced    Spouse Name: N/A    Number of Children: N/A  . Years of Education: N/A   Occupational History  . Not on file.   Social History  Main Topics  . Smoking status: Former Games developer  . Smokeless tobacco: Not on file   Comment: Smoked cigars but quit smoking in 1987 after smoking for 20 yrs.   . Alcohol Use: No  . Drug Use: Not on file  . Sexually Active: Not on file   Other Topics Concern  . Not on file   Social History Narrative   Widowed. Retired (worked Adult nurse)    WUJ:[id number] positives as outlined above. The remainder of the 18  point review of systems is negativ  PHYSICAL EXAM BP 106/65  Pulse 58  Resp 18  Ht 6' (1.829 m)  Wt 197 lb 12.8 oz (89.721 kg)  BMI 26.83 kg/m2  SpO2 95%  General: Well-developed, well-nourished in no distress Head: Normocephalic and atraumatic Eyes:PERRLA/EOMI intact, conjunctiva and lids normal Ears: No deformity or  lesions Mouth:normal dentition, normal posterior pharynx Neck: Supple, no JVD.  No masses, thyromegaly or abnormal cervical nodes Lungs: Normal breath sounds bilaterally without wheezing.  Normal percussion Chest: Implantable loop monitor in the left upper subclavicular space. Cardiac: regular rate and rhythm with normal S1 and S2, no S3 or S4.  PMI is normal.  No pathological murmurs Abdomen: Normal bowel sounds, abdomen is soft and nontender without masses, organomegaly or hernias noted.  No hepatosplenomegaly MSK: Back normal, normal gait muscle strength and tone normal Vascular: Pulse is normal in all 4 extremities Extremities: No peripheral pitting edema Neurologic: Alert and oriented x 3 Skin: Intact without lesions or rashes Lymphatics: No significant adenopathy Psychologic: Normal affect  ECG: Atrial fibrillation with heart rate of approximately 110 beats per minute  ASSESSMENT AND PLAN

## 2011-06-14 ENCOUNTER — Encounter: Payer: Self-pay | Admitting: Internal Medicine

## 2011-06-14 ENCOUNTER — Ambulatory Visit (INDEPENDENT_AMBULATORY_CARE_PROVIDER_SITE_OTHER): Payer: Medicare Other | Admitting: Internal Medicine

## 2011-06-14 DIAGNOSIS — R55 Syncope and collapse: Secondary | ICD-10-CM

## 2011-06-14 DIAGNOSIS — I4891 Unspecified atrial fibrillation: Secondary | ICD-10-CM

## 2011-06-14 DIAGNOSIS — I499 Cardiac arrhythmia, unspecified: Secondary | ICD-10-CM

## 2011-06-14 LAB — PACEMAKER DEVICE OBSERVATION

## 2011-06-14 NOTE — Assessment & Plan Note (Signed)
Recent EKG from 05/31/11 reviewed documenting afib ILR today reveals that his afib burden is 0.2% I have spoke with Dr Andee Lineman in the office today about anticoagulation: Given ESRD with increased bleeding risks on coumadin and very low afib burden, we will continue ASA and Plavix at this time.

## 2011-06-14 NOTE — Progress Notes (Signed)
The patient presents today for routine electrophysiology followup.  Since last being seen in our clinic, the patient reports doing very well.  He denies syncope in 2 years.  He remains active.  He is on dialysis.  Today, he denies symptoms of palpitations, chest pain, shortness of breath,  lower extremity edema, dizziness, presyncope, syncope, or neurologic sequela.  The patient feels that he is tolerating medications without difficulties and is otherwise without complaint today.   Past Medical History  Diagnosis Date  . CAD (coronary artery disease)     a. abnormal perfusion imaging study with multipe, large fixed  defects, EF39% b. EF 50-55% with apical akinesis by 2D echo. Status post coronary intervention with drug-eluting stent placement x3 October 2010 EF 40-45%   . Ventricular ectopy     a. no documented nonsustained ventricular tachycardia   . Anemia     chronic  . Renal failure     chronic, on hemodialysis  . Hypothyroidism   . Diabetes mellitus   . HTN (hypertension)   . Hemodialysis adequacy testing     through a Vas-cath catheer  . Paroxysmal atrial fibrillation   . Syncope     s/p ILR   Past Surgical History  Procedure Date  . Cataract extraction   . Cholecystectomy   . Implantable loop recorder     Current Outpatient Prescriptions  Medication Sig Dispense Refill  . aspirin 325 MG tablet Take 325 mg by mouth daily.        . Calcium Acetate 667 MG TABS Take by mouth 3 (three) times daily with meals.        . clopidogrel (PLAVIX) 75 MG tablet Take 75 mg by mouth daily.        . folic acid-vitamin b complex-vitamin c-selenium-zinc (DIALYVITE) 3 MG TABS Take 1 tablet by mouth daily.        . insulin NPH (HUMULIN N) 100 UNIT/ML injection Inject into the skin as directed.        Marland Kitchen levothyroxine (LEVOTHROID) 175 MCG tablet Take 175 mcg by mouth daily.        . metoprolol (TOPROL-XL) 50 MG 24 hr tablet Take 50 mg by mouth daily.        . polyethylene glycol powder (MIRALAX)  powder Take 17 g by mouth daily.        . simvastatin (ZOCOR) 40 MG tablet TAKE 1 TABLET ONCE DAILY  30 tablet  6    No Known Allergies  History   Social History  . Marital Status: Divorced    Spouse Name: N/A    Number of Children: N/A  . Years of Education: N/A   Occupational History  . Not on file.   Social History Main Topics  . Smoking status: Former Smoker    Quit date: 10/21/1986  . Smokeless tobacco: Not on file   Comment: Smoked cigars but quit smoking in 1987 after smoking for 20 yrs.   . Alcohol Use: No  . Drug Use: Not on file  . Sexually Active: Not on file   Other Topics Concern  . Not on file   Social History Narrative   Widowed. Retired (worked Haematologist a Arboriculturist)     Family History  Problem Relation Age of Onset  . Diabetes Other   . Hypertension Other    Physical Exam: Filed Vitals:   06/14/11 1341  BP: 154/74  Pulse: 66  Height: 5\' 11"  (1.803 m)  Weight: 203 lb 6.4 oz (92.262  kg)    GEN- The patient is chronically ill appearing, alert and oriented x 3 today.   Head- normocephalic, atraumatic Eyes-  Sclera clear, conjunctiva pink Ears- hearing intact Oropharynx- clear Neck- supple, no JVP Lymph- no cervical lymphadenopathy Lungs- Clear to ausculation bilaterally, normal work of breathing Chest- ILR site is well healed Heart- Regular rate and rhythm, no murmurs, rubs or gallops, PMI not laterally displaced GI- soft, NT, ND, + BS Extremities- no clubbing, cyanosis, trace edema  Loop recorder interrogation- reviewed in detail today,  See PACEART report  Assessment and Plan:

## 2011-06-14 NOTE — Assessment & Plan Note (Signed)
ILR reveals an asymptomatic 3 second pause NO syncope in 2 years We will continue to follow

## 2011-07-02 ENCOUNTER — Other Ambulatory Visit (HOSPITAL_COMMUNITY): Payer: Self-pay | Admitting: Nephrology

## 2011-07-02 DIAGNOSIS — N186 End stage renal disease: Secondary | ICD-10-CM

## 2011-07-03 ENCOUNTER — Other Ambulatory Visit (HOSPITAL_COMMUNITY): Payer: Self-pay | Admitting: Interventional Radiology

## 2011-07-03 ENCOUNTER — Other Ambulatory Visit (HOSPITAL_COMMUNITY): Payer: Self-pay | Admitting: Nephrology

## 2011-07-05 ENCOUNTER — Ambulatory Visit (HOSPITAL_COMMUNITY): Payer: Medicare Other

## 2011-07-10 ENCOUNTER — Ambulatory Visit (HOSPITAL_COMMUNITY)
Admission: RE | Admit: 2011-07-10 | Discharge: 2011-07-10 | Disposition: A | Discharge status: Home / Self Care | Payer: Medicare Other | Source: Ambulatory Visit | Attending: Nephrology | Admitting: Nephrology

## 2011-07-10 DIAGNOSIS — N186 End stage renal disease: Secondary | ICD-10-CM | POA: Insufficient documentation

## 2011-07-10 DIAGNOSIS — T82898A Other specified complication of vascular prosthetic devices, implants and grafts, initial encounter: Secondary | ICD-10-CM | POA: Insufficient documentation

## 2011-07-10 DIAGNOSIS — Y832 Surgical operation with anastomosis, bypass or graft as the cause of abnormal reaction of the patient, or of later complication, without mention of misadventure at the time of the procedure: Secondary | ICD-10-CM | POA: Insufficient documentation

## 2011-07-10 MED ORDER — IOHEXOL 300 MG/ML  SOLN
100.0000 mL | Freq: Once | INTRAMUSCULAR | Status: AC | PRN
  Administered 2011-07-10: 40 mL via INTRAVENOUS

## 2011-08-15 ENCOUNTER — Encounter: Payer: Self-pay | Admitting: Cardiology

## 2011-09-03 ENCOUNTER — Ambulatory Visit: Payer: Medicare Other | Admitting: Cardiology

## 2011-09-04 ENCOUNTER — Ambulatory Visit: Payer: Medicare Other | Admitting: Cardiology

## 2011-09-04 ENCOUNTER — Other Ambulatory Visit: Payer: Self-pay | Admitting: Internal Medicine

## 2011-09-06 ENCOUNTER — Encounter: Payer: Self-pay | Admitting: Cardiology

## 2011-10-10 ENCOUNTER — Ambulatory Visit (INDEPENDENT_AMBULATORY_CARE_PROVIDER_SITE_OTHER): Payer: Medicare Other | Admitting: *Deleted

## 2011-10-10 ENCOUNTER — Encounter: Payer: Self-pay | Admitting: Internal Medicine

## 2011-10-10 DIAGNOSIS — I499 Cardiac arrhythmia, unspecified: Secondary | ICD-10-CM

## 2011-10-10 DIAGNOSIS — R55 Syncope and collapse: Secondary | ICD-10-CM

## 2011-10-10 NOTE — Progress Notes (Signed)
Loop recorder check in clinic  

## 2011-10-28 ENCOUNTER — Ambulatory Visit: Payer: Medicare Other | Admitting: Cardiology

## 2011-10-30 ENCOUNTER — Ambulatory Visit: Payer: Medicare Other | Admitting: Cardiology

## 2011-11-08 ENCOUNTER — Encounter: Payer: Self-pay | Admitting: Cardiology

## 2011-11-08 ENCOUNTER — Ambulatory Visit (INDEPENDENT_AMBULATORY_CARE_PROVIDER_SITE_OTHER): Payer: Medicare Other | Admitting: Cardiology

## 2011-11-08 VITALS — BP 146/78 | HR 58 | Ht 72.0 in | Wt 202.0 lb

## 2011-11-08 DIAGNOSIS — I2510 Atherosclerotic heart disease of native coronary artery without angina pectoris: Secondary | ICD-10-CM

## 2011-11-08 DIAGNOSIS — R55 Syncope and collapse: Secondary | ICD-10-CM | POA: Insufficient documentation

## 2011-11-08 DIAGNOSIS — I493 Ventricular premature depolarization: Secondary | ICD-10-CM

## 2011-11-08 DIAGNOSIS — I4891 Unspecified atrial fibrillation: Secondary | ICD-10-CM

## 2011-11-08 DIAGNOSIS — I4949 Other premature depolarization: Secondary | ICD-10-CM

## 2011-11-08 DIAGNOSIS — I480 Paroxysmal atrial fibrillation: Secondary | ICD-10-CM

## 2011-11-08 NOTE — Progress Notes (Signed)
Matthew Bottoms, MD, [facility] ABIM Board Certified in Adult Cardiovascular Medicine,Internal Medicine and Critical Care Medicine    CC: Followup patient coronary artery disease and history of syncope  HPI:  The patient is 71 year old male with history of coronary artery disease status post multiple stent placement several years ago. He has had no recurrent chest pain. He has a history of mild LV dysfunction as well as syncope with negative EP study. He still has an implantable loop monitor in place which was interrogated in December of 2012 and showed no arrhythmias. Interestingly the patient was in atrial fibrillation during his last office visit with me but has now remained in normal sinus rhythm. I also interrogated the patient in the office today and he remains in normal sinus rhythm. The patient has never been started on Coumadin given his high risk for bleeding in combination therapy of aspirin and Plavix. The patient states that he is doing well. He reports no chest pain shortness of breath orthopnea PND. He stable from a cardiovascular perspective. Blood pressure slightly elevated but tends to around low on hemodialysis.  PMH: reviewed and listed in Problem List in Electronic Records (and see below) Past Medical History  Diagnosis Date  . CAD (coronary artery disease)     a. abnormal perfusion imaging study with multipe, large fixed  defects, EF39% b. EF 50-55% with apical akinesis by 2D echo. Status post coronary intervention with drug-eluting stent placement x3 October 2010 EF 40-45%   . Ventricular ectopy      history of nonsustained ventricular tachycardia with negative EP study and negative for implantable loop recorder for recurrent VT.   Marland Kitchen Anemia     chronic  . Renal failure     chronic, on hemodialysis  . Hypothyroidism   . Diabetes mellitus   . HTN (hypertension)   . Hemodialysis adequacy testing     through a Vas-cath catheer  . Paroxysmal atrial fibrillation     No  recurrent atrial fibrillation by interrogation of implantable loop monitor  . Syncope     s/p ILR no recurrent syncope and negative for arrhythmias last interrogation December 2012   Past Surgical History  Procedure Date  . Cataract extraction   . Cholecystectomy   . Implantable loop recorder     Allergies/SH/FHX : available in Electronic Records for review  No Known Allergies History   Social History  . Marital Status: Divorced    Spouse Name: N/A    Number of Children: N/A  . Years of Education: N/A   Occupational History  . Not on file.   Social History Main Topics  . Smoking status: Former Smoker -- 2.0 packs/day for 20 years    Types: Cigars    Quit date: 10/21/1986  . Smokeless tobacco: Never Used   Comment: Smoked cigars but quit smoking in 1987 after smoking for 20 yrs.   . Alcohol Use: No  . Drug Use: Not on file  . Sexually Active: Not on file   Other Topics Concern  . Not on file   Social History Narrative   Widowed. Retired (worked Haematologist a Arboriculturist)    Family History  Problem Relation Age of Onset  . Diabetes Other   . Hypertension Other     Medications: Current Outpatient Prescriptions  Medication Sig Dispense Refill  . aspirin 325 MG tablet Take 325 mg by mouth daily.        . Calcium Acetate 667 MG TABS Take 2 tablets  by mouth 3 (three) times daily with meals. & 1 with snacks      . clopidogrel (PLAVIX) 75 MG tablet Take 75 mg by mouth daily.        . folic acid-vitamin b complex-vitamin c-selenium-zinc (DIALYVITE) 3 MG TABS Take 1 tablet by mouth daily.        . insulin NPH (HUMULIN N) 100 UNIT/ML injection Inject into the skin as directed.        Marland Kitchen levothyroxine (LEVOTHROID) 175 MCG tablet Take 175 mcg by mouth daily.        . metoprolol (TOPROL-XL) 50 MG 24 hr tablet TAKE 1 TABLET ONCE DAILY  30 tablet  6  . polyethylene glycol powder (MIRALAX) powder Take 17 g by mouth daily.        . simvastatin (ZOCOR) 40 MG tablet TAKE 1 TABLET  ONCE DAILY  30 tablet  6    ROS: No nausea or vomiting. No fever or chills.No melena or hematochezia.No bleeding.No claudication. No palpitations  Physical Exam: BP 146/78  Pulse 58  Ht 6' (1.829 m)  Wt 202 lb (91.627 kg)  BMI 27.40 kg/m2 General: Well-nourished white male in no distress Neck: Normal carotid upstroke no carotid bruits. No thyromegaly nonnodular thyroid JVP is 5-6 cm Lungs: Clear breath sounds bilaterally. No wheezing Cardiac: Regular rate and rhythm with normal S1-S2 no significant pathological murmurs Vascular: No edema. Palpable dorsalis pedis and posterior tibial pulses bilaterally Skin: Warm and dry Physcologic: Normal affect  12lead ECG: Not obtained Limited bedside ECHO: Ejection fraction estimated at 50-55% with no wall motion abnormalities apparent normal aortic and mitral valve.  Patient Active Problem List  Diagnoses  . UNSPECIFIED HYPOTHYROIDISM  . DIAB W/RENAL MANIFESTS TYPE II/UNS NOT UNCNTRL  . DIAB W/OTH MANIFESTS TYPE II/UNS NOT UNCNTRL  . HYPERLIPIDEMIA  . HYPERPOTASSEMIA  . ANEMIA-followed at the dialysis center   . HTN CKD UNSPEC W/CKD STAGE V/ESRD  . AV NODAL REENTRY TACHYCARDIA  . LEFT VENTRICULAR FUNCTION, DECREASED  . 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 ACUTE KIDNEY FAILURE UNSPECIFIED-end-stage renal disease on hemodialysis   . Peripheral vascular disease  . CAD (coronary artery disease) status post multiple stents on aspirin and Plavix   . Ventricular ectopy  . Paroxysmal atrial fibrillation no recurrence patient remains in normal sinus rhythm   . Syncope no recurrence a negative interrogation of implantable loop monitor     PLAN   Patient is doing well from a cardiovascular perspective. He is asymptomatic  We did a bedside echocardiogram and his ejection fraction was 50-55% with no wall motion abnormalities. There was no significant valvular disease.  The patient had an interrogation of his loop monitor in  December and there was no evidence of recurrent atrial fibrillation. Also in clinic today he remains in normal sinus rhythm  I made no change in the patient's medical regimen no indication for Coumadin.  We will follow the patient in 6 months.

## 2011-11-08 NOTE — Patient Instructions (Signed)
Continue all current medications. Your physician wants you to follow up in: 6 months.  You will receive a reminder letter in the mail one-two months in advance.  If you don't receive a letter, please call our office to schedule the follow up appointment   

## 2011-12-10 ENCOUNTER — Other Ambulatory Visit: Payer: Self-pay | Admitting: *Deleted

## 2011-12-10 MED ORDER — SIMVASTATIN 40 MG PO TABS: 40.0000 mg | ORAL_TABLET | Freq: Every day | ORAL | Status: DC

## 2012-01-10 ENCOUNTER — Encounter: Payer: Self-pay | Admitting: Internal Medicine

## 2012-01-10 ENCOUNTER — Ambulatory Visit (INDEPENDENT_AMBULATORY_CARE_PROVIDER_SITE_OTHER): Payer: Medicare Other | Admitting: *Deleted

## 2012-01-10 DIAGNOSIS — I4891 Unspecified atrial fibrillation: Secondary | ICD-10-CM

## 2012-01-10 DIAGNOSIS — I480 Paroxysmal atrial fibrillation: Secondary | ICD-10-CM

## 2012-01-10 DIAGNOSIS — I498 Other specified cardiac arrhythmias: Secondary | ICD-10-CM

## 2012-01-10 NOTE — Progress Notes (Signed)
Loop recorder check in clinic  

## 2012-04-02 ENCOUNTER — Other Ambulatory Visit: Payer: Self-pay | Admitting: Cardiology

## 2012-04-02 ENCOUNTER — Other Ambulatory Visit (HOSPITAL_COMMUNITY): Payer: Self-pay | Admitting: *Deleted

## 2012-04-02 MED ORDER — SIMVASTATIN 40 MG PO TABS: 40.0000 mg | ORAL_TABLET | Freq: Every day | ORAL | Status: DC

## 2012-04-02 MED ORDER — METOPROLOL SUCCINATE ER 50 MG PO TB24: 50.0000 mg | ORAL_TABLET | Freq: Every day | ORAL | Status: DC

## 2012-04-02 NOTE — Telephone Encounter (Signed)
Refilled metoprolol 

## 2012-04-03 ENCOUNTER — Encounter: Payer: Self-pay | Admitting: Internal Medicine

## 2012-04-03 ENCOUNTER — Other Ambulatory Visit: Payer: Self-pay

## 2012-04-03 ENCOUNTER — Ambulatory Visit (INDEPENDENT_AMBULATORY_CARE_PROVIDER_SITE_OTHER): Payer: Medicare Other | Admitting: *Deleted

## 2012-04-03 DIAGNOSIS — R55 Syncope and collapse: Secondary | ICD-10-CM

## 2012-04-03 MED ORDER — METOPROLOL SUCCINATE ER 50 MG PO TB24: 50.0000 mg | ORAL_TABLET | Freq: Every day | ORAL | Status: DC

## 2012-04-03 NOTE — Progress Notes (Signed)
Loop recorder check in clinic  

## 2012-04-06 LAB — PACEMAKER DEVICE OBSERVATION

## 2012-04-22 ENCOUNTER — Telehealth: Payer: Self-pay | Admitting: Cardiology

## 2012-04-22 NOTE — Telephone Encounter (Signed)
Pharmacy called wanting to know status on simvastatin refill request it was refilled on 04-02-12 by [facility]. Pharmacy never received the refill. I gave verbal orders to pharmacist Delong to refill simvastatin 40 MG once daily qty of 90 3 refills told pharmacy to put note that patient needs follow up appointment with Dr. Earnestine Leys. Burnadette Pop, CMA

## 2012-07-09 ENCOUNTER — Encounter: Payer: Self-pay | Admitting: Internal Medicine

## 2012-07-09 ENCOUNTER — Ambulatory Visit (INDEPENDENT_AMBULATORY_CARE_PROVIDER_SITE_OTHER): Payer: Medicare Other | Admitting: Internal Medicine

## 2012-07-09 VITALS — BP 112/72 | HR 88 | Ht 72.0 in | Wt 204.0 lb

## 2012-07-09 DIAGNOSIS — I4891 Unspecified atrial fibrillation: Secondary | ICD-10-CM

## 2012-07-09 DIAGNOSIS — R55 Syncope and collapse: Secondary | ICD-10-CM

## 2012-07-09 DIAGNOSIS — I2510 Atherosclerotic heart disease of native coronary artery without angina pectoris: Secondary | ICD-10-CM

## 2012-07-09 MED ORDER — WARFARIN SODIUM 5 MG PO TABS: 5.0000 mg | ORAL_TABLET | Freq: Every day | ORAL | Status: DC

## 2012-07-09 NOTE — Assessment & Plan Note (Signed)
ILR interrogation today is normal No further syncope since 2010

## 2012-07-09 NOTE — Progress Notes (Signed)
PCP: TAPPER,DAVID B, MD   The patient presents today for routine electrophysiology followup.  Since last being seen in our clinic, the patient reports doing very well.  He denies syncope in 3 years.  He remains active.  He is on dialysis.  His BP is low at times.  He has no symptoms with afib.  He has had no ischemic episodes since 2010. Today, he denies symptoms of palpitations, chest pain, shortness of breath,  lower extremity edema, dizziness, presyncope, syncope, or neurologic sequela.  The patient feels that he is tolerating medications without difficulties and is otherwise without complaint today.   Past Medical History  Diagnosis Date  . CAD (coronary artery disease)     a. abnormal perfusion imaging study with multipe, large fixed  defects, EF39% b. EF 50-55% with apical akinesis by 2D echo. Status post coronary intervention with drug-eluting stent placement x3 October 2010 EF 40-45%   . Ventricular ectopy     History of nonsustained ventricular tachycardia., Status post EP study negative for inducible ventricular tachycardia  . Anemia     chronic  . Renal failure     chronic, on hemodialysis  . Hypothyroidism   . Diabetes mellitus   . HTN (hypertension)   . Hemodialysis adequacy testing     through a Vas-cath catheer  . Paroxysmal atrial fibrillation   . Syncope     s/p ILR no recurrent syncope and negative for arrhythmias last interrogation December 2012   Past Surgical History  Procedure Date  . Cataract extraction   . Cholecystectomy   . Implantable loop recorder     Current Outpatient Prescriptions  Medication Sig Dispense Refill  . aspirin 325 MG tablet Take 325 mg by mouth daily. STOP IN 5 DAYS OF STARTING COUMADIN      . Calcium Acetate 667 MG TABS Take 2 tablets by mouth 2 (two) times daily with a meal. & 1 with snacks      . clopidogrel (PLAVIX) 75 MG tablet Take 75 mg by mouth daily. STOP IN 5 DAYS OF STARTING COUMADIN      . folic acid-vitamin b complex-vitamin  c-selenium-zinc (DIALYVITE) 3 MG TABS Take 1 tablet by mouth daily.        . insulin aspart protamine-insulin aspart (NOVOLOG 70/30) (70-30) 100 UNIT/ML injection Inject 30-40 Units into the skin 2 (two) times daily with a meal.       . levothyroxine (LEVOTHROID) 175 MCG tablet Take 175 mcg by mouth daily.        . metoprolol succinate (TOPROL-XL) 50 MG 24 hr tablet Take 50 mg by mouth every other day. Take with or immediately following a meal.      . polyethylene glycol powder (MIRALAX) powder Take 17 g by mouth as needed.       Marland Kitchen RENVELA 800 MG tablet Take 800 mg by mouth 3 (three) times daily with meals. Take three tablets with 2 meals and take two tablets with 1 snack.      . simvastatin (ZOCOR) 40 MG tablet Take 1 tablet (40 mg total) by mouth at bedtime.  30 tablet  1  . warfarin (COUMADIN) 5 MG tablet Take 5 mg by mouth daily.      Marland Kitchen DISCONTD: metoprolol succinate (TOPROL-XL) 50 MG 24 hr tablet Take 1 tablet (50 mg total) by mouth daily. Take with or immediately following a meal.  30 tablet  5    No Known Allergies  History   Social History  .  Marital Status: Divorced    Spouse Name: N/A    Number of Children: N/A  . Years of Education: N/A   Occupational History  . Not on file.   Social History Main Topics  . Smoking status: Former Smoker -- 2.0 packs/day for 20 years    Types: Cigars    Quit date: 10/21/1986  . Smokeless tobacco: Never Used   Comment: Smoked cigars but quit smoking in 1987 after smoking for 20 yrs.   . Alcohol Use: No  . Drug Use: Not on file  . Sexually Active: Not on file   Other Topics Concern  . Not on file   Social History Narrative   Widowed. Retired (worked Haematologist a Arboriculturist)     Family History  Problem Relation Age of Onset  . Diabetes Other   . Hypertension Other    Physical Exam: Filed Vitals:   07/09/12 1042  BP: 112/72  Pulse: 88  Height: 6' (1.829 m)  Weight: 204 lb (92.534 kg)  SpO2: 98%    GEN- The patient is  chronically ill appearing, alert and oriented x 3 today.   Head- normocephalic, atraumatic Eyes-  Sclera clear, conjunctiva pink Ears- hearing intact Oropharynx- clear Neck- supple, no JVP Lymph- no cervical lymphadenopathy Lungs- Clear to ausculation bilaterally, normal work of breathing Chest- ILR site is well healed Heart- irregular rate and rhythm, no murmurs, rubs or gallops, PMI not laterally displaced GI- soft, NT, ND, + BS Extremities- no clubbing, cyanosis, trace edema  Loop recorder interrogation- reviewed in detail today,  See PACEART report  Assessment and Plan:

## 2012-07-09 NOTE — Patient Instructions (Addendum)
Your physician wants you to follow-up in: 6 months with [facility] device clinic.  You will receive a reminder letter in the mail two months in advance. If you don't receive a letter, please call our office to schedule the follow-up appointment.  Your physician recommends that you schedule a follow-up appointment in: 1 year with Dr. Johney Frame. You will receive a reminder letter in the mail in about 10 months reminding you to call and schedule your appointment. If you don't receive this letter, please contact our office.  Your physician has recommended you make the following change in your medication: Start coumadin 5 mg daily every pm. Please stop your aspirin and plavix in 5 days of starting your coumadin. A full discussion of the nature of anticoagulants has been carried out.  A benefit risk analysis has been presented to the patient, so that they understand the justification for choosing anticoagulation at this time. The need for frequent and regular monitoring, precise dosage adjustment and compliance is stressed.  Side effects of potential bleeding are discussed.  The patient should avoid any OTC items containing aspirin or ibuprofen, and should avoid great swings in general diet.  Avoid alcohol consumption.  Call if any signs of abnormal bleeding.  Next PT/INR test in 5 days.  Your physician recommends that you schedule a follow-up appointment in: 4 weeks with a cardiologist.

## 2012-07-09 NOTE — Assessment & Plan Note (Signed)
ILR interrogation reveals that he is now in afib 25% of the time, though he appears to be asymptomatic I think at this point that we need to initiate anticoagulation with coumadin. As he has been stable from a CAD standpoint, we will stop ASA and Plavix once his INR is therapeutic Return to the coumadin clinic in 5 days

## 2012-07-09 NOTE — Assessment & Plan Note (Signed)
We will stop ASA and plavix and initiate coumadin for afib  Return in 4 weeks to follow-up with general cardiology.

## 2012-07-14 ENCOUNTER — Ambulatory Visit (INDEPENDENT_AMBULATORY_CARE_PROVIDER_SITE_OTHER): Payer: Medicare Other | Admitting: *Deleted

## 2012-07-14 DIAGNOSIS — I4891 Unspecified atrial fibrillation: Secondary | ICD-10-CM

## 2012-07-14 DIAGNOSIS — Z7901 Long term (current) use of anticoagulants: Secondary | ICD-10-CM | POA: Insufficient documentation

## 2012-07-14 LAB — POCT INR: INR: 3.2

## 2012-07-16 ENCOUNTER — Telehealth: Payer: Self-pay | Admitting: *Deleted

## 2012-07-16 MED ORDER — ATORVASTATIN CALCIUM 20 MG PO TABS: 20.0000 mg | ORAL_TABLET | Freq: Every evening | ORAL | Status: DC

## 2012-07-16 NOTE — Telephone Encounter (Signed)
Received fax from [facility] - possible drug interaction with Simvastatin & Warfarin.  Combination may increase the risk of bleeding.    Per Gene Serpe, PA - discontinue Zocor (Simvastatin) and substitute with Lipitor 20mg  every evening.    Patient notified of above & is in agreement to change.  Will send 30 day supply to local pharmacy [facility]) first & if tolerate med - will send 90 day supply to mail order.

## 2012-07-24 ENCOUNTER — Ambulatory Visit (INDEPENDENT_AMBULATORY_CARE_PROVIDER_SITE_OTHER): Payer: Medicare Other | Admitting: *Deleted

## 2012-07-24 DIAGNOSIS — Z7901 Long term (current) use of anticoagulants: Secondary | ICD-10-CM

## 2012-07-24 DIAGNOSIS — I4891 Unspecified atrial fibrillation: Secondary | ICD-10-CM

## 2012-07-31 ENCOUNTER — Ambulatory Visit (INDEPENDENT_AMBULATORY_CARE_PROVIDER_SITE_OTHER): Payer: Medicare Other | Admitting: *Deleted

## 2012-07-31 DIAGNOSIS — Z7901 Long term (current) use of anticoagulants: Secondary | ICD-10-CM

## 2012-07-31 DIAGNOSIS — I4891 Unspecified atrial fibrillation: Secondary | ICD-10-CM

## 2012-07-31 LAB — POCT INR: INR: 2.2

## 2012-08-03 ENCOUNTER — Encounter: Payer: Self-pay | Admitting: Cardiology

## 2012-08-03 ENCOUNTER — Ambulatory Visit (INDEPENDENT_AMBULATORY_CARE_PROVIDER_SITE_OTHER): Payer: Medicare Other | Admitting: Cardiology

## 2012-08-03 VITALS — BP 132/79 | HR 99 | Ht 72.0 in | Wt 213.0 lb

## 2012-08-03 DIAGNOSIS — I4891 Unspecified atrial fibrillation: Secondary | ICD-10-CM

## 2012-08-03 DIAGNOSIS — I480 Paroxysmal atrial fibrillation: Secondary | ICD-10-CM

## 2012-08-03 DIAGNOSIS — E785 Hyperlipidemia, unspecified: Secondary | ICD-10-CM

## 2012-08-03 DIAGNOSIS — I2510 Atherosclerotic heart disease of native coronary artery without angina pectoris: Secondary | ICD-10-CM

## 2012-08-03 NOTE — Progress Notes (Signed)
Patient ID: Matthew Espinoza, male   DOB: [birth year], 71 y.o.   MRN: [id number] PCP: Dr. Margo Common  71 yo with history of paroxysmal atrial fibrillation, CAD s/p PCI, and NSVT returns for cardiology followup.  He has had no ischemic events since he had multiple DES to the RCA territory in 2010.  He denies chest pain or tightness.  He is in atrial fibrillation today.  Last loop recorder interrogation showed 25% atrial fibrillation and he was started on coumadin about a month ago.  He has had no bleeding complications.  He does not feel palpitations/irregular heart rate so really does not seem to notice when he is in atrial fibrillation.  He is only taking his Toprol XL every other day (not on dialysis days), and HR is a bit elevated. He has exertional dyspnea walking long distances or going up steps.  Can walk to pond to go fishing without much trouble.  EF 50-55% on last echo.  No syncope.   ECG: atrial fibrillation, old inferior MI  PMH: 1. ESRD on HD 2. CAD: LHC (10/10) total occlusion LAD, multiple lesions in RCA territory; patient had DES to dRCA, PDA, PLV1, PLV2.   3. H/o NSVT: Negative EP study.  Has ILR, have not seen sustained VT.  4. Chronic anemia 5. Hypothyroidism 6. HTN 7. DM 8. Paroxysmal atrial fibrillation: Last ILR interrogation in 9/13 showed 25% atrial fibrillation.  9. Syncope: ILR without explanation for syncope so far.  10. Cholecystectomy.  11. Ischemic cardiomyopathy: Bedside echo in 9/13 with EF 50-55%.   SH: Divorced, quit smoking in 1988, retired from Furniture conservator/restorer.  Likes to play cards, fish.   FH: CAD  ROS: All systems reviewed and negative except as per HPI.   Current Outpatient Prescriptions  Medication Sig Dispense Refill  . atorvastatin (LIPITOR) 20 MG tablet Take 1 tablet (20 mg total) by mouth every evening.  30 tablet  1  . Calcium Acetate 667 MG TABS Take 2 tablets by mouth 2 (two) times daily with a meal. & 1 with snacks      . folic acid-vitamin  b complex-vitamin c-selenium-zinc (DIALYVITE) 3 MG TABS Take 1 tablet by mouth daily.        . insulin aspart protamine-insulin aspart (NOVOLOG 70/30) (70-30) 100 UNIT/ML injection Inject 30-40 Units into the skin 2 (two) times daily with a meal.       . levothyroxine (LEVOTHROID) 175 MCG tablet Take 175 mcg by mouth daily.        . metoprolol succinate (TOPROL-XL) 50 MG 24 hr tablet Take 50 mg by mouth every other day. Take with or immediately following a meal.      . polyethylene glycol powder (MIRALAX) powder Take 17 g by mouth as needed.       Marland Kitchen RENVELA 800 MG tablet Take 800 mg by mouth 3 (three) times daily with meals. Take three tablets with 2 meals and take two tablets with 1 snack.      . warfarin (COUMADIN) 5 MG tablet Take 1 tablet (5 mg total) by mouth daily.  30 tablet  1    BP 132/79  Pulse 99  Ht 6' (1.829 m)  Wt 213 lb (96.616 kg)  BMI 28.89 kg/m2 General: NAD Neck: EJ dilated, hard to see IJ pulsations, no thyromegaly or thyroid  Lungs: Slight crackles left base.  CV: Nondisplaced PMI.  Heart irregular S1/S2, no S3/S4, no murmur.  1+ ankle edema bilaterally.  No carotid bruit.  Abdomen: Soft, nontender, no hepatosplenomegaly, no distention.  Neurologic: Alert and oriented x 3.  Psych: Normal affect. Extremities: No clubbing or cyanosis.   Assessment/Plan: 1. Atrial fibrillation: Rate is a bit high.  I would like him to take Toprol XL daily rather than every other day.  He can take it in the afternoons so that it will not have as much of an effect on dialysis.  He will continue coumadin.  2. CAD: Stable with no ischemic symptoms.  He is on coumadin now rather than ASA/Plavix.  He will continue statin and Toprol XL.  EF 50-55% on last echo.  3. Hyperlipidemia: Goal LDL < 70.  He says lipids were done recently at dialysis.  I will call to get a copy.  Continue statin.   Marca Ancona 08/03/2012 9:07 AM

## 2012-08-03 NOTE — Patient Instructions (Addendum)
Your physician recommends that you schedule a follow-up appointment in: 6 months. You will receive a reminder letter in the mail in about 4 months reminding you to call and schedule your appointment. If you don't receive this letter, please contact our office. Your physician recommends that you continue on your current medications as directed. Please refer to the Current Medication list given to you today.   Take your metoprolol succinate 50 mg daily instead of every other day.

## 2012-08-07 ENCOUNTER — Ambulatory Visit (INDEPENDENT_AMBULATORY_CARE_PROVIDER_SITE_OTHER): Payer: Medicare Other | Admitting: *Deleted

## 2012-08-07 DIAGNOSIS — Z7901 Long term (current) use of anticoagulants: Secondary | ICD-10-CM

## 2012-08-07 DIAGNOSIS — I4891 Unspecified atrial fibrillation: Secondary | ICD-10-CM

## 2012-08-07 LAB — POCT INR: INR: 2.6

## 2012-08-20 ENCOUNTER — Other Ambulatory Visit: Payer: Self-pay

## 2012-08-20 MED ORDER — ATORVASTATIN CALCIUM 20 MG PO TABS: 20.0000 mg | ORAL_TABLET | Freq: Every evening | ORAL | Status: DC

## 2012-08-20 NOTE — Telephone Encounter (Signed)
..   Requested Prescriptions   Signed Prescriptions Disp Refills  . atorvastatin (LIPITOR) 20 MG tablet 30 tablet 3    Sig: Take 1 tablet (20 mg total) by mouth every evening.    Authorizing Provider: Prescott Parma    Ordering User: Christella Hartigan, Thressa Shiffer Judie Petit

## 2012-08-20 NOTE — Telephone Encounter (Signed)
..   Requested Prescriptions   Signed Prescriptions Disp Refills  . atorvastatin (LIPITOR) 20 MG tablet 90 tablet 3    Sig: Take 1 tablet (20 mg total) by mouth every evening.    Authorizing Provider: Prescott Parma    Ordering User: Nicholes Hibler M  resent in for 90 day supply

## 2012-08-21 ENCOUNTER — Ambulatory Visit (INDEPENDENT_AMBULATORY_CARE_PROVIDER_SITE_OTHER): Payer: Medicare Other | Admitting: *Deleted

## 2012-08-21 DIAGNOSIS — Z7901 Long term (current) use of anticoagulants: Secondary | ICD-10-CM

## 2012-08-21 DIAGNOSIS — I4891 Unspecified atrial fibrillation: Secondary | ICD-10-CM

## 2012-08-21 LAB — POCT INR: INR: 2.4

## 2012-09-11 ENCOUNTER — Ambulatory Visit (INDEPENDENT_AMBULATORY_CARE_PROVIDER_SITE_OTHER): Payer: Medicare Other | Admitting: *Deleted

## 2012-09-11 DIAGNOSIS — I4891 Unspecified atrial fibrillation: Secondary | ICD-10-CM

## 2012-09-11 DIAGNOSIS — Z7901 Long term (current) use of anticoagulants: Secondary | ICD-10-CM

## 2012-09-11 LAB — POCT INR: INR: 3.2

## 2012-10-02 ENCOUNTER — Other Ambulatory Visit: Payer: Self-pay

## 2012-10-02 DIAGNOSIS — I4891 Unspecified atrial fibrillation: Secondary | ICD-10-CM

## 2012-10-02 MED ORDER — WARFARIN SODIUM 5 MG PO TABS: 5.0000 mg | ORAL_TABLET | Freq: Every day | ORAL | Status: DC

## 2012-10-09 ENCOUNTER — Ambulatory Visit (INDEPENDENT_AMBULATORY_CARE_PROVIDER_SITE_OTHER): Payer: Medicare Other | Admitting: *Deleted

## 2012-10-09 DIAGNOSIS — I4891 Unspecified atrial fibrillation: Secondary | ICD-10-CM

## 2012-10-09 DIAGNOSIS — Z7901 Long term (current) use of anticoagulants: Secondary | ICD-10-CM

## 2012-10-23 ENCOUNTER — Ambulatory Visit (INDEPENDENT_AMBULATORY_CARE_PROVIDER_SITE_OTHER): Payer: Medicare Other | Admitting: *Deleted

## 2012-10-23 DIAGNOSIS — I4891 Unspecified atrial fibrillation: Secondary | ICD-10-CM

## 2012-10-23 DIAGNOSIS — Z7901 Long term (current) use of anticoagulants: Secondary | ICD-10-CM

## 2012-10-23 LAB — POCT INR: INR: 2.5

## 2012-11-20 ENCOUNTER — Ambulatory Visit (INDEPENDENT_AMBULATORY_CARE_PROVIDER_SITE_OTHER): Payer: Medicare Other | Admitting: *Deleted

## 2012-11-20 DIAGNOSIS — I4891 Unspecified atrial fibrillation: Secondary | ICD-10-CM

## 2012-11-20 DIAGNOSIS — Z7901 Long term (current) use of anticoagulants: Secondary | ICD-10-CM

## 2012-11-20 LAB — POCT INR: INR: 5

## 2012-11-27 ENCOUNTER — Ambulatory Visit (INDEPENDENT_AMBULATORY_CARE_PROVIDER_SITE_OTHER): Payer: Medicare Other | Admitting: *Deleted

## 2012-11-27 DIAGNOSIS — I4891 Unspecified atrial fibrillation: Secondary | ICD-10-CM

## 2012-11-27 DIAGNOSIS — Z7901 Long term (current) use of anticoagulants: Secondary | ICD-10-CM

## 2012-11-27 LAB — POCT INR: INR: 2.8

## 2012-12-11 ENCOUNTER — Ambulatory Visit (INDEPENDENT_AMBULATORY_CARE_PROVIDER_SITE_OTHER): Payer: Medicare Other | Admitting: *Deleted

## 2012-12-11 DIAGNOSIS — I4891 Unspecified atrial fibrillation: Secondary | ICD-10-CM

## 2013-01-01 ENCOUNTER — Ambulatory Visit (INDEPENDENT_AMBULATORY_CARE_PROVIDER_SITE_OTHER): Payer: Medicare Other | Admitting: *Deleted

## 2013-01-29 ENCOUNTER — Ambulatory Visit (INDEPENDENT_AMBULATORY_CARE_PROVIDER_SITE_OTHER): Payer: Medicare Other | Admitting: *Deleted

## 2013-01-29 DIAGNOSIS — I4891 Unspecified atrial fibrillation: Secondary | ICD-10-CM

## 2013-01-29 DIAGNOSIS — Z7901 Long term (current) use of anticoagulants: Secondary | ICD-10-CM

## 2013-02-12 ENCOUNTER — Encounter: Payer: Self-pay | Admitting: Cardiovascular Disease

## 2013-02-12 ENCOUNTER — Ambulatory Visit: Payer: Medicare Other | Admitting: Cardiology

## 2013-02-12 ENCOUNTER — Ambulatory Visit (INDEPENDENT_AMBULATORY_CARE_PROVIDER_SITE_OTHER): Payer: Medicare Other | Admitting: Cardiovascular Disease

## 2013-02-12 VITALS — BP 132/73 | HR 103 | Ht 72.0 in | Wt 206.0 lb

## 2013-02-12 DIAGNOSIS — I4891 Unspecified atrial fibrillation: Secondary | ICD-10-CM

## 2013-02-12 NOTE — Progress Notes (Signed)
Patient ID: Matthew Espinoza, male   DOB: August 06, 1941, 72 y.o.   MRN: [id number] PCP: Dr. Margo Common  72 yo with history of atrial fibrillation likely chronic now, CAD s/p PCI, and NSVT returns for cardiology followup.  He has had no ischemic events since he had multiple DES to the RCA territory in 2010.  He denies chest pain or tightness.    EF 50-55% on last echo.  No syncope.  During last visit, he was noted to be in atrial fibrillation with a heart rate of 98 beats per minute. He was taking Toprol daily except on the days that he had dialysis. He was asked to take Toprol daily in the evening and he has been doing that. He continues to be in atrial fibrillation but denies any palpitations. He has no chest pain. His dyspnea is stable.  ECG: atrial fibrillation, old inferior MI  PMH: 1. ESRD on HD 2. CAD: LHC (10/10) total occlusion LAD, multiple lesions in RCA territory; patient had DES to dRCA, PDA, PLV1, PLV2.   3. H/o NSVT: Negative EP study.  Has ILR, have not seen sustained VT.  4. Chronic anemia 5. Hypothyroidism 6. HTN 7. DM 8. Paroxysmal atrial fibrillation: Last ILR interrogation in 9/13 showed 25% atrial fibrillation.  9. Syncope: ILR without explanation for syncope so far.  10. Cholecystectomy.  11. Ischemic cardiomyopathy: Bedside echo in 9/13 with EF 50-55%.   SH: Divorced, quit smoking in 1988, retired from Furniture conservator/restorer.  Likes to play cards, fish.   FH: CAD  ROS: All systems reviewed and negative except as per HPI.   Current Outpatient Prescriptions  Medication Sig Dispense Refill  . atorvastatin (LIPITOR) 20 MG tablet Take 1 tablet (20 mg total) by mouth every evening.  90 tablet  3  . Calcium Acetate 667 MG TABS Take 2 tablets by mouth 2 (two) times daily with a meal. & 1 with snacks      . folic acid-vitamin b complex-vitamin c-selenium-zinc (DIALYVITE) 3 MG TABS Take 1 tablet by mouth daily.        . insulin aspart protamine-insulin aspart (NOVOLOG 70/30)  (70-30) 100 UNIT/ML injection Inject 30-40 Units into the skin 2 (two) times daily with a meal.       . levothyroxine (LEVOTHROID) 175 MCG tablet Take 175 mcg by mouth daily.        . metoprolol succinate (TOPROL-XL) 50 MG 24 hr tablet Take 50 mg by mouth daily. Take with or immediately following a meal.      . polyethylene glycol powder (MIRALAX) powder Take 17 g by mouth as needed.       Marland Kitchen RENVELA 800 MG tablet Take 800 mg by mouth 3 (three) times daily with meals. Take three tablets with 2 meals and take two tablets with 1 snack.      . SENSIPAR 30 MG tablet Take 1 tablet by mouth daily.      Marland Kitchen warfarin (COUMADIN) 5 MG tablet Take 1 tablet (5 mg total) by mouth daily.  30 tablet  2   No current facility-administered medications for this visit.    BP 132/73  Pulse 103  Ht 6' (1.829 m)  Wt 206 lb (93.441 kg)  BMI 27.93 kg/m2 General: NAD Neck: EJ dilated, hard to see IJ pulsations, no thyromegaly or thyroid  Lungs: Slight crackles left base.  CV: Nondisplaced PMI.  Heart irregular S1/S2, no S3/S4, no murmur.  1+ ankle edema bilaterally.  No carotid bruit.   Abdomen:  Soft, nontender, no hepatosplenomegaly, no distention.  Neurologic: Alert and oriented x 3.  Psych: Normal affect. Extremities: No clubbing or cyanosis.   Assessment/Plan: 1. Atrial fibrillation: His rate is slightly high but is now taking Toprol daily which will be continued. He was having problems with hypotension during dialysis and thus I am hesitant to increase the dose of Toprol. He is tolerating anticoagulation with warfarin. 2. CAD: Stable with no ischemic symptoms.  He is on coumadin now rather than ASA/Plavix.  He will continue statin and Toprol XL.  EF 50-55% on last echo.  3. Hyperlipidemia: Goal LDL < 70.    Continue statin.   Lorine Bears 02/12/2013 6:05 PM

## 2013-02-12 NOTE — Patient Instructions (Addendum)
Continue same medications.  Follow up in 6 months.  

## 2013-02-26 ENCOUNTER — Ambulatory Visit (INDEPENDENT_AMBULATORY_CARE_PROVIDER_SITE_OTHER): Payer: Medicare Other | Admitting: *Deleted

## 2013-02-26 DIAGNOSIS — Z7901 Long term (current) use of anticoagulants: Secondary | ICD-10-CM

## 2013-02-26 DIAGNOSIS — I4891 Unspecified atrial fibrillation: Secondary | ICD-10-CM

## 2013-03-08 ENCOUNTER — Other Ambulatory Visit: Payer: Self-pay | Admitting: Emergency Medicine

## 2013-03-08 DIAGNOSIS — I4891 Unspecified atrial fibrillation: Secondary | ICD-10-CM

## 2013-03-08 MED ORDER — WARFARIN SODIUM 5 MG PO TABS: ORAL_TABLET | ORAL | Status: DC

## 2013-03-08 NOTE — Telephone Encounter (Signed)
First rx printed out.  Sending escribe

## 2013-03-16 ENCOUNTER — Other Ambulatory Visit: Payer: Self-pay | Admitting: Emergency Medicine

## 2013-03-16 MED ORDER — METOPROLOL SUCCINATE ER 50 MG PO TB24: 50.0000 mg | ORAL_TABLET | Freq: Every day | ORAL | Status: DC

## 2013-03-23 ENCOUNTER — Other Ambulatory Visit: Payer: Self-pay | Admitting: *Deleted

## 2013-03-23 MED ORDER — METOPROLOL SUCCINATE ER 50 MG PO TB24: 50.0000 mg | ORAL_TABLET | Freq: Every day | ORAL | Status: DC

## 2013-03-23 NOTE — Telephone Encounter (Signed)
Pharmacy called wanting pt toprol refilled and wanted office to either call it in or fax it in. Called pharmacy to ask if it was ok for [place] to send in escript and Lelon Perla stated it was ok to. It just takes 24-48hrs to process. Fax Received. Refill Completed. Frederico Gerling Chowoe (R.M.A)

## 2013-03-26 ENCOUNTER — Ambulatory Visit (INDEPENDENT_AMBULATORY_CARE_PROVIDER_SITE_OTHER): Payer: Medicare Other | Admitting: *Deleted

## 2013-03-26 DIAGNOSIS — I4891 Unspecified atrial fibrillation: Secondary | ICD-10-CM

## 2013-03-26 DIAGNOSIS — Z7901 Long term (current) use of anticoagulants: Secondary | ICD-10-CM

## 2013-05-07 ENCOUNTER — Ambulatory Visit (INDEPENDENT_AMBULATORY_CARE_PROVIDER_SITE_OTHER): Payer: Medicare Other | Admitting: *Deleted

## 2013-05-07 DIAGNOSIS — I4891 Unspecified atrial fibrillation: Secondary | ICD-10-CM

## 2013-05-07 DIAGNOSIS — Z7901 Long term (current) use of anticoagulants: Secondary | ICD-10-CM

## 2013-06-04 ENCOUNTER — Ambulatory Visit (INDEPENDENT_AMBULATORY_CARE_PROVIDER_SITE_OTHER): Payer: Medicare Other | Admitting: *Deleted

## 2013-06-04 DIAGNOSIS — I4891 Unspecified atrial fibrillation: Secondary | ICD-10-CM

## 2013-06-04 DIAGNOSIS — Z7901 Long term (current) use of anticoagulants: Secondary | ICD-10-CM

## 2013-06-22 ENCOUNTER — Other Ambulatory Visit: Payer: Self-pay | Admitting: Internal Medicine

## 2013-06-22 ENCOUNTER — Other Ambulatory Visit: Payer: Self-pay | Admitting: Physician Assistant

## 2013-07-01 ENCOUNTER — Other Ambulatory Visit: Payer: Self-pay | Admitting: *Deleted

## 2013-07-01 MED ORDER — METOPROLOL SUCCINATE ER 50 MG PO TB24: 50.0000 mg | ORAL_TABLET | Freq: Every day | ORAL | Status: DC

## 2013-07-02 ENCOUNTER — Ambulatory Visit (INDEPENDENT_AMBULATORY_CARE_PROVIDER_SITE_OTHER): Payer: Medicare Other | Admitting: *Deleted

## 2013-07-02 DIAGNOSIS — I4891 Unspecified atrial fibrillation: Secondary | ICD-10-CM

## 2013-07-02 DIAGNOSIS — Z7901 Long term (current) use of anticoagulants: Secondary | ICD-10-CM

## 2013-07-16 ENCOUNTER — Ambulatory Visit (INDEPENDENT_AMBULATORY_CARE_PROVIDER_SITE_OTHER): Payer: Medicare Other | Admitting: *Deleted

## 2013-07-16 DIAGNOSIS — Z7901 Long term (current) use of anticoagulants: Secondary | ICD-10-CM

## 2013-07-16 DIAGNOSIS — I4891 Unspecified atrial fibrillation: Secondary | ICD-10-CM

## 2013-07-16 LAB — POCT INR: INR: 2.6

## 2013-07-23 ENCOUNTER — Encounter: Payer: Self-pay | Admitting: Internal Medicine

## 2013-07-23 ENCOUNTER — Ambulatory Visit (INDEPENDENT_AMBULATORY_CARE_PROVIDER_SITE_OTHER): Payer: Medicare Other | Admitting: Internal Medicine

## 2013-07-23 VITALS — BP 115/70 | HR 81 | Ht 72.0 in | Wt 200.0 lb

## 2013-07-23 DIAGNOSIS — I4892 Unspecified atrial flutter: Secondary | ICD-10-CM | POA: Insufficient documentation

## 2013-07-23 DIAGNOSIS — R55 Syncope and collapse: Secondary | ICD-10-CM

## 2013-07-23 DIAGNOSIS — I4891 Unspecified atrial fibrillation: Secondary | ICD-10-CM

## 2013-07-23 DIAGNOSIS — Z7901 Long term (current) use of anticoagulants: Secondary | ICD-10-CM

## 2013-07-23 DIAGNOSIS — I2510 Atherosclerotic heart disease of native coronary artery without angina pectoris: Secondary | ICD-10-CM

## 2013-07-23 LAB — PACEMAKER DEVICE OBSERVATION

## 2013-07-23 NOTE — Patient Instructions (Signed)
Continue all current medications. Follow up as needed  

## 2013-07-23 NOTE — Progress Notes (Signed)
PCP: Louie Boston, MD Primary Cardiologist:  Dr Kirke Corin  The patient presents today for routine electrophysiology followup.  Since last being seen in our clinic, the patient reports doing very well.  He denies syncope in 4 years.  He remains active.  He is on dialysis.  His BP is low at times.  He has no symptoms with afib or atrial flutter.  He has had no ischemic episodes since 2010. Today, he denies symptoms of palpitations, chest pain, shortness of breath,  lower extremity edema, dizziness, presyncope, syncope, or neurologic sequela.  The patient feels that he is tolerating medications without difficulties and is otherwise without complaint today.   Past Medical History  Diagnosis Date  . CAD (coronary artery disease)     a. abnormal perfusion imaging study with multipe, large fixed  defects, EF39% b. EF 50-55% with apical akinesis by 2D echo. Status post coronary intervention with drug-eluting stent placement x3 October 2010 EF 40-45%   . Ventricular ectopy     History of nonsustained ventricular tachycardia., Status post EP study negative for inducible ventricular tachycardia  . Anemia     chronic  . Renal failure     chronic, on hemodialysis  . Hypothyroidism   . Diabetes mellitus   . HTN (hypertension)   . Hemodialysis adequacy testing     through a Vas-cath catheer  . Paroxysmal atrial fibrillation   . Syncope     s/p ILR no recurrent syncope and negative for arrhythmias last interrogation December 2012   Past Surgical History  Procedure Laterality Date  . Cataract extraction    . Cholecystectomy    . Implantable loop recorder      Current Outpatient Prescriptions  Medication Sig Dispense Refill  . atorvastatin (LIPITOR) 20 MG tablet Take 1 tablet (20 mg total) by mouth every evening.  90 tablet  3  . atorvastatin (LIPITOR) 20 MG tablet TAKE ONE TABLET IN THE EVENING  30 tablet  4  . Calcium Acetate 667 MG TABS Take 2 tablets by mouth 2 (two) times daily with a meal. & 1  with snacks      . folic acid-vitamin b complex-vitamin c-selenium-zinc (DIALYVITE) 3 MG TABS Take 1 tablet by mouth daily.        Marland Kitchen levothyroxine (LEVOTHROID) 175 MCG tablet Take 175 mcg by mouth daily.        . metoprolol succinate (TOPROL-XL) 50 MG 24 hr tablet Take 1 tablet (50 mg total) by mouth daily. Take with or immediately following a meal.  90 tablet  1  . NOVOLIN N 100 UNIT/ML injection Inject 40 Units into the skin as directed.       . polyethylene glycol powder (MIRALAX) powder Take 17 g by mouth as needed.       Marland Kitchen RENVELA 800 MG tablet Take 800 mg by mouth 3 (three) times daily with meals. Take three tablets with 2 meals and take two tablets with 1 snack.      . SENSIPAR 30 MG tablet Take 1 tablet by mouth daily.      Marland Kitchen warfarin (COUMADIN) 5 MG tablet Sun            Mon           Tue       Wed            Thu           Fri  Sat   2.5 mg     2.5 mg      2.5 mg      2.5 mg       5 mg        2.5 mg         2.5 mg  30 tablet  1  . warfarin (COUMADIN) 5 MG tablet Take 1 tablet (5 mg total) by mouth as directed.  30 tablet  3   No current facility-administered medications for this visit.    No Known Allergies  History   Social History  . Marital Status: Divorced    Spouse Name: N/A    Number of Children: N/A  . Years of Education: N/A   Occupational History  . Not on file.   Social History Main Topics  . Smoking status: Former Smoker -- 2.00 packs/day for 20 years    Types: Cigars    Quit date: 10/21/1986  . Smokeless tobacco: Never Used     Comment: Smoked cigars but quit smoking in 1987 after smoking for 20 yrs.   . Alcohol Use: No  . Drug Use: Not on file  . Sexual Activity: Not on file   Other Topics Concern  . Not on file   Social History Narrative   Widowed. Retired (worked Haematologist a Arboriculturist)     Family History  Problem Relation Age of Onset  . Diabetes Other   . Hypertension Other    Physical Exam: Filed Vitals:   07/23/13 0814   BP: 115/70  Pulse: 81  Height: 6' (1.829 m)  Weight: 200 lb (90.719 kg)    GEN- The patient is chronically ill appearing, alert and oriented x 3 today.   Head- normocephalic, atraumatic Eyes-  Sclera clear, conjunctiva pink Ears- hearing intact Oropharynx- clear Neck- supple,  Lungs- Clear to ausculation bilaterally, normal work of breathing Chest- ILR site is well healed Heart- irregular rate and rhythm, no murmurs, rubs or gallops, PMI not laterally displaced GI- soft, NT, ND, + BS Extremities- no clubbing, cyanosis, trace edema  Loop recorder interrogation- reviewed in detail today,  See PACEART report  Assessment and Plan:   1. Atrial fibrillation/ atrial flutter 44% atrial arrhythmias by device interrogation- mostly atrial flutter He is clear that he does not wish to consider AAD therapy or ablation Continue rate control long term Long term anticoagulation  2. Syncope- resolved No arrhythmias on ILR interrogation His loop monitor has reached end of life.  I have recommended device removal. He is clear that he does not wish to undergo the procedure.  He states "If it wont hurt anything, I think Ill just keep it where it is" If he changes his mind, he should call my office.  3. CAD Stable No change required today  I will return his care completely to general cardiology and see as needed going forward

## 2013-08-06 ENCOUNTER — Ambulatory Visit (INDEPENDENT_AMBULATORY_CARE_PROVIDER_SITE_OTHER): Payer: Medicare Other | Admitting: *Deleted

## 2013-08-06 DIAGNOSIS — Z7901 Long term (current) use of anticoagulants: Secondary | ICD-10-CM

## 2013-08-06 DIAGNOSIS — I4891 Unspecified atrial fibrillation: Secondary | ICD-10-CM

## 2013-08-06 LAB — POCT INR: INR: 2.4

## 2013-08-09 ENCOUNTER — Encounter: Payer: Self-pay | Admitting: Cardiology

## 2013-08-16 ENCOUNTER — Ambulatory Visit (INDEPENDENT_AMBULATORY_CARE_PROVIDER_SITE_OTHER): Payer: Medicare Other | Admitting: Cardiology

## 2013-08-16 ENCOUNTER — Encounter: Payer: Self-pay | Admitting: Cardiology

## 2013-08-16 VITALS — BP 119/75 | HR 79 | Ht 72.0 in | Wt 205.0 lb

## 2013-08-16 DIAGNOSIS — I25810 Atherosclerosis of coronary artery bypass graft(s) without angina pectoris: Secondary | ICD-10-CM

## 2013-08-16 MED ORDER — ATORVASTATIN CALCIUM 80 MG PO TABS: 80.0000 mg | ORAL_TABLET | Freq: Every evening | ORAL | Status: DC

## 2013-08-16 NOTE — Patient Instructions (Signed)
Your physician recommends that you schedule a follow-up appointment in: 3 months with Dr. Wyline Mood. This appointment will be scheduled today before you leave.  Your physician has recommended you make the following change in your medication:  Increase Atorvastatin (Lipitor) 80 MG once daily. A prescription has been sent to your pharmacy. You may take 4 tablets of the 20 MG Atorvastatin (Lipitor) until you get the new prescription. Start: Aspirin 81 MG daily.   Continue all other medications the same.   Please bring a copy of your recent labs to you next office visit.

## 2013-08-16 NOTE — Progress Notes (Signed)
Clinical Summary Matthew Espinoza is a 72 y.o.male  1. CAD/ICM - prior stenting to RCA in 2010. LVEF 50-55% on last echo per clinic notes, this was a bedside echo peformed Jan 2013  - denies any chest pain. No SOB or DOE. Fairly sedentary lifestyle, denies significant DOE at his normal level of activity  - no orthopnea, no PND, no LE edema - compliant with meds: not on ASA previously. He is on atorva and Toprol - reports taken off ASA when he was started on coumadin previously  2. Afib - followed by Matthew Espinoza in EP clinic - denies any symptoms - recent loop recorder check during last EP appointment showed 44% atrial arrhythmias with mostly atrial flutter, decision to remain on current therapy, patient does not want ablation or antiarrhythmics at this time  3. Sycnope - no recent episodes - per EP note, loop recorder has reached end of life. At this time patient prefers not to have it removed.  4. Hyperlipidemia - reports lipids checked regularly at dialysis, no recent numbers in our system - compliant with atorva   Past Medical History  Diagnosis Date  . CAD (coronary artery disease)     a. abnormal perfusion imaging study with multipe, large fixed  defects, EF39% b. EF 50-55% with apical akinesis by 2D echo. Status post coronary intervention with drug-eluting stent placement x3 October 2010 EF 40-45%   . Ventricular ectopy     History of nonsustained ventricular tachycardia., Status post EP study negative for inducible ventricular tachycardia  . Anemia     chronic  . Renal failure     chronic, on hemodialysis  . Hypothyroidism   . Diabetes mellitus   . HTN (hypertension)   . Hemodialysis adequacy testing     through a Vas-cath catheer  . Paroxysmal atrial fibrillation   . Syncope     s/p ILR no recurrent syncope and negative for arrhythmias last interrogation December 2012     No Known Allergies   Current Outpatient Prescriptions  Medication Sig Dispense Refill  .  atorvastatin (LIPITOR) 20 MG tablet Take 1 tablet (20 mg total) by mouth every evening.  90 tablet  3  . atorvastatin (LIPITOR) 20 MG tablet TAKE ONE TABLET IN THE EVENING  30 tablet  4  . Calcium Acetate 667 MG TABS Take 2 tablets by mouth 2 (two) times daily with a meal. & 1 with snacks      . folic acid-vitamin b complex-vitamin c-selenium-zinc (DIALYVITE) 3 MG TABS Take 1 tablet by mouth daily.        Marland Kitchen levothyroxine (LEVOTHROID) 175 MCG tablet Take 175 mcg by mouth daily.        . metoprolol succinate (TOPROL-XL) 50 MG 24 hr tablet Take 1 tablet (50 mg total) by mouth daily. Take with or immediately following a meal.  90 tablet  1  . NOVOLIN N 100 UNIT/ML injection Inject 40 Units into the skin as directed.       . polyethylene glycol powder (MIRALAX) powder Take 17 g by mouth as needed.       Marland Kitchen RENVELA 800 MG tablet Take 800 mg by mouth 3 (three) times daily with meals. Take three tablets with 2 meals and take two tablets with 1 snack.      . SENSIPAR 30 MG tablet Take 1 tablet by mouth daily.      Marland Kitchen warfarin (COUMADIN) 5 MG tablet Sun  Mon           Tue       Wed            Thu           Fri                 Sat   2.5 mg     2.5 mg      2.5 mg      2.5 mg       5 mg        2.5 mg         2.5 mg  30 tablet  1  . warfarin (COUMADIN) 5 MG tablet Take 1 tablet (5 mg total) by mouth as directed.  30 tablet  3   No current facility-administered medications for this visit.     Past Surgical History  Procedure Laterality Date  . Cataract extraction    . Cholecystectomy    . Implantable loop recorder       No Known Allergies    Family History  Problem Relation Age of Onset  . Diabetes Other   . Hypertension Other      Social History Matthew Espinoza reports that he quit smoking about 26 years ago. His smoking use included Cigars. He has never used smokeless tobacco. Matthew Espinoza reports that he does not drink alcohol.   Review of Systems CONSTITUTIONAL: No weight loss, fever,  chills, weakness or fatigue.  HEENT: Eyes: No visual loss, blurred vision, double vision or yellow sclerae.No hearing loss, sneezing, congestion, runny nose or sore throat.  SKIN: No rash or itching.  CARDIOVASCULAR: per HPI RESPIRATORY: No shortness of breath, cough or sputum.  GASTROINTESTINAL: No anorexia, nausea, vomiting or diarrhea. No abdominal pain or blood.  GENITOURINARY: No burning on urination, no polyuria NEUROLOGICAL: No headache, dizziness, syncope, paralysis, ataxia, numbness or tingling in the extremities. No change in bowel or bladder control.  MUSCULOSKELETAL: No muscle, back pain, joint pain or stiffness.  LYMPHATICS: No enlarged nodes. No history of splenectomy.  PSYCHIATRIC: No history of depression or anxiety.  ENDOCRINOLOGIC: No reports of sweating, cold or heat intolerance. No polyuria or polydipsia.  Marland Kitchen   Physical Examination p 79 bp 119/75 Wt 205 lbs BMI 28 Gen: resting comfortably, no acute distress HEENT: no scleral icterus, pupils equal round and reactive, no palptable cervical adenopathy,  CV: irreg, rate 75, no m/r/g, no JVD, no carotid bruits Resp: Clear to auscultation bilaterally GI: abdomen is soft, non-tender, non-distended, normal bowel sounds, no hepatosplenomegaly MSK: extremities are warm, no edema.  Skin: warm, no rash Neuro:  no focal deficits Psych: appropriate affect   Diagnostic Studies 07/2009 Echo: :LVEF 35-40%, mild LVH, apical akinesis, grade I diastolic dyfuncttion, mod LAE  07/2009 Cath:  LAD occlusion, severe RCA disease  Assessment and Plan  1. CAD - no current symptoms - will start ASA 81 mg daily for prevention  - reports some low blood pressures during dialysis, will not start ACE-I at this time.   2. Afib -no current symptoms - continue rate control with Toprol, continue regular EP follow up - continue coumadin for stroke prophylaxis  3. Syncope - no recent episodes - has loop recorder with run down battery,  prefers to not go through procedure for removal at this time  4. Hyperlipidemia - I have asked him to bring with him his most recent lipid panel from the dialysis center at the next visit.  - in  setting of known CAD, will increase to lipitor 80mg  consistent with new lipid guidelines.       Matthew Espinoza, M.D., F.A.C.C.

## 2013-09-03 ENCOUNTER — Ambulatory Visit (INDEPENDENT_AMBULATORY_CARE_PROVIDER_SITE_OTHER): Payer: Medicare Other | Admitting: *Deleted

## 2013-09-03 DIAGNOSIS — I4891 Unspecified atrial fibrillation: Secondary | ICD-10-CM

## 2013-09-03 DIAGNOSIS — Z7901 Long term (current) use of anticoagulants: Secondary | ICD-10-CM

## 2013-09-24 ENCOUNTER — Ambulatory Visit (INDEPENDENT_AMBULATORY_CARE_PROVIDER_SITE_OTHER): Payer: Medicare Other | Admitting: *Deleted

## 2013-09-24 DIAGNOSIS — I4891 Unspecified atrial fibrillation: Secondary | ICD-10-CM

## 2013-09-24 DIAGNOSIS — Z7901 Long term (current) use of anticoagulants: Secondary | ICD-10-CM

## 2013-10-22 ENCOUNTER — Ambulatory Visit (INDEPENDENT_AMBULATORY_CARE_PROVIDER_SITE_OTHER): Payer: Medicare Other | Admitting: Cardiology

## 2013-10-22 ENCOUNTER — Ambulatory Visit (INDEPENDENT_AMBULATORY_CARE_PROVIDER_SITE_OTHER): Payer: Medicare Other | Admitting: *Deleted

## 2013-10-22 VITALS — BP 118/74 | HR 91 | Ht 72.0 in | Wt 205.8 lb

## 2013-10-22 DIAGNOSIS — I4891 Unspecified atrial fibrillation: Secondary | ICD-10-CM

## 2013-10-22 DIAGNOSIS — I2510 Atherosclerotic heart disease of native coronary artery without angina pectoris: Secondary | ICD-10-CM

## 2013-10-22 DIAGNOSIS — E785 Hyperlipidemia, unspecified: Secondary | ICD-10-CM

## 2013-10-22 DIAGNOSIS — Z7901 Long term (current) use of anticoagulants: Secondary | ICD-10-CM

## 2013-10-22 LAB — POCT INR: INR: 6.8

## 2013-10-22 NOTE — Progress Notes (Signed)
Clinical Summary Mr. Grotz is a 73 y.o.male seen today for follow up of the following medical problems.   1. CAD/ICM  - prior stenting to RCA in 2010. LVEF 50-55% on last echo per clinic notes, this was a bedside echo peformed Jan 2013  - denies any chest pain since last visit. No SOB or DOE. Fairly sedentary lifestyle, denies significant DOE at his normal level of activity which is unchanged.  - no orthopnea, no PND, no LE edema  - compliant with meds   2. Afib  - followed by Dr Johney Frame in EP clinic  - denies any symptoms recently  3. History of syncope - no current symptoms - patient has a loop recorder implanted sometime ago, he has elected to keep it in place as oppose to have it removed.   4. Hyperlipidemia  - reports lipids checked regularly at dialysis, no recent numbers in our system. He did not bring lab results with him today.  - compliant with atorva    Past Medical History  Diagnosis Date  . CAD (coronary artery disease)     a. abnormal perfusion imaging study with multipe, large fixed  defects, EF39% b. EF 50-55% with apical akinesis by 2D echo. Status post coronary intervention with drug-eluting stent placement x3 October 2010 EF 40-45%   . Ventricular ectopy     History of nonsustained ventricular tachycardia., Status post EP study negative for inducible ventricular tachycardia  . Anemia     chronic  . Renal failure     chronic, on hemodialysis  . Hypothyroidism   . Diabetes mellitus   . HTN (hypertension)   . Hemodialysis adequacy testing     through a Vas-cath catheer  . Paroxysmal atrial fibrillation   . Syncope     s/p ILR no recurrent syncope and negative for arrhythmias last interrogation December 2012     No Known Allergies   Current Outpatient Prescriptions  Medication Sig Dispense Refill  . aspirin 81 MG tablet Take 81 mg by mouth daily.      Marland Kitchen atorvastatin (LIPITOR) 80 MG tablet Take 1 tablet (80 mg total) by mouth every evening.  90  tablet  3  . Calcium Acetate 667 MG TABS Take 2 tablets by mouth 2 (two) times daily with a meal. & 1 with snacks      . folic acid-vitamin b complex-vitamin c-selenium-zinc (DIALYVITE) 3 MG TABS Take 1 tablet by mouth daily.        Marland Kitchen levothyroxine (LEVOTHROID) 175 MCG tablet Take 175 mcg by mouth daily.        . metoprolol succinate (TOPROL-XL) 50 MG 24 hr tablet Take 1 tablet (50 mg total) by mouth daily. Take with or immediately following a meal.  90 tablet  1  . NOVOLIN N 100 UNIT/ML injection Inject 40 Units into the skin as directed.       . polyethylene glycol powder (MIRALAX) powder Take 17 g by mouth as needed.       Marland Kitchen RENVELA 800 MG tablet Take 800 mg by mouth 3 (three) times daily with meals. Take three tablets with 2 meals and take two tablets with 1 snack.      . SENSIPAR 30 MG tablet Take 1 tablet by mouth daily.      Marland Kitchen warfarin (COUMADIN) 5 MG tablet Sun            Mon           Tue  Wed            Thu           Fri                 Sat   2.5 mg     2.5 mg      2.5 mg      2.5 mg       5 mg        2.5 mg         2.5 mg  30 tablet  1  . warfarin (COUMADIN) 5 MG tablet Take 1 tablet (5 mg total) by mouth as directed.  30 tablet  3   No current facility-administered medications for this visit.     Past Surgical History  Procedure Laterality Date  . Cataract extraction    . Cholecystectomy    . Implantable loop recorder       No Known Allergies    Family History  Problem Relation Age of Onset  . Diabetes Other   . Hypertension Other      Social History Mr. Mabie reports that he quit smoking about 27 years ago. His smoking use included Cigars. He has never used smokeless tobacco. Mr. Hora reports that he does not drink alcohol.   Review of Systems CONSTITUTIONAL: No weight loss, fever, chills, weakness or fatigue.  HEENT: Eyes: No visual loss, blurred vision, double vision or yellow sclerae.No hearing loss, sneezing, congestion, runny nose or sore throat.    SKIN: No rash or itching.  CARDIOVASCULAR: per HPI RESPIRATORY: No shortness of breath, cough or sputum.  GASTROINTESTINAL: No anorexia, nausea, vomiting or diarrhea. No abdominal pain or blood.  GENITOURINARY: No burning on urination, no polyuria NEUROLOGICAL: No headache, dizziness, syncope, paralysis, ataxia, numbness or tingling in the extremities. No change in bowel or bladder control.  MUSCULOSKELETAL: No muscle, back pain, joint pain or stiffness.  LYMPHATICS: No enlarged nodes. No history of splenectomy.  PSYCHIATRIC: No history of depression or anxiety.  ENDOCRINOLOGIC: No reports of sweating, cold or heat intolerance. No polyuria or polydipsia.  Marland Kitchen   Physical Examination p 91 bp 118/74 Wt 205 lbs BMI 28 Gen: resting comfortably, no acute distress HEENT: no scleral icterus, pupils equal round and reactive, no palptable cervical adenopathy,  CV: irreg, no m/r/g, no JVD, no carotid bruits Resp: Clear to auscultation bilaterally GI: abdomen is soft, non-tender, non-distended, normal bowel sounds, no hepatosplenomegaly MSK: extremities are warm, no edema.  Skin: warm, no rash Neuro:  no focal deficits Psych: appropriate affect   Diagnostic Studies 07/2009 Echo: :LVEF 35-40%, mild LVH, apical akinesis, grade I diastolic dyfuncttion, mod LAE   07/2009 Cath:  LAD occlusion, severe RCA disease     Assessment and Plan  1. CAD  - no current symptoms   - reports some low blood pressures during dialysis, will not start ACE-I at this time. Continue current meds   2. Afib  -no current symptoms  - continue rate control with Toprol, continue regular EP follow up  - continue coumadin for stroke prophylaxis   3. Syncope  - no recent episodes  - has loop recorder with run down battery, prefers to not go through procedure for removal at this time   4. Hyperlipidemia  - will request recent lipid panel from his dialysis center - in setting of known CAD, last visit we increased  his lipitor to 80mg  consistent with new lipid guidelines.     Follow up 6  months   Antoine PocheJonathan F. Abriana Saltos, M.D., F.A.C.C.

## 2013-10-22 NOTE — Patient Instructions (Addendum)
Your physician recommends that you schedule a follow-up appointment in: 6 month with Dr. Wyline MoodBranch. You should receive a letter in the mail in 4 months. If you do not receive this letter by May 2015 call our office to schedule this appointment.   Your physician recommends that you continue on your current medications as directed. Please refer to the Current Medication list given to you today.  Misty StanleyLisa [facility] to have lab work done.

## 2013-10-29 ENCOUNTER — Ambulatory Visit (INDEPENDENT_AMBULATORY_CARE_PROVIDER_SITE_OTHER): Payer: Medicare Other | Admitting: *Deleted

## 2013-10-29 DIAGNOSIS — I4891 Unspecified atrial fibrillation: Secondary | ICD-10-CM

## 2013-10-29 DIAGNOSIS — Z7901 Long term (current) use of anticoagulants: Secondary | ICD-10-CM

## 2013-10-29 DIAGNOSIS — Z5181 Encounter for therapeutic drug level monitoring: Secondary | ICD-10-CM

## 2013-10-29 LAB — POCT INR: INR: 2

## 2013-11-12 ENCOUNTER — Ambulatory Visit (INDEPENDENT_AMBULATORY_CARE_PROVIDER_SITE_OTHER): Payer: Medicare Other | Admitting: *Deleted

## 2013-11-12 DIAGNOSIS — Z5181 Encounter for therapeutic drug level monitoring: Secondary | ICD-10-CM

## 2013-11-12 DIAGNOSIS — Z7901 Long term (current) use of anticoagulants: Secondary | ICD-10-CM

## 2013-11-12 DIAGNOSIS — I4891 Unspecified atrial fibrillation: Secondary | ICD-10-CM

## 2013-11-12 LAB — POCT INR: INR: 4.4

## 2013-11-18 ENCOUNTER — Telehealth: Payer: Self-pay | Admitting: Cardiology

## 2013-11-18 NOTE — Telephone Encounter (Signed)
Told pt to continue coumadin 1/2 tablet daily since I just decreased it last week.   Eat extra greens/salads.  Keep INR appt for 11/26/13.  Pt verbalized understanding.

## 2013-11-18 NOTE — Telephone Encounter (Signed)
Just picked up DOXYCYCLINE MONO 100 MG to  start taking

## 2013-11-26 ENCOUNTER — Ambulatory Visit (INDEPENDENT_AMBULATORY_CARE_PROVIDER_SITE_OTHER): Payer: Medicare Other | Admitting: *Deleted

## 2013-11-26 DIAGNOSIS — Z7901 Long term (current) use of anticoagulants: Secondary | ICD-10-CM

## 2013-11-26 DIAGNOSIS — I4891 Unspecified atrial fibrillation: Secondary | ICD-10-CM

## 2013-11-26 DIAGNOSIS — Z5181 Encounter for therapeutic drug level monitoring: Secondary | ICD-10-CM

## 2013-11-26 LAB — POCT INR: INR: 4.7

## 2013-12-03 ENCOUNTER — Ambulatory Visit (INDEPENDENT_AMBULATORY_CARE_PROVIDER_SITE_OTHER): Payer: Medicare Other | Admitting: *Deleted

## 2013-12-03 DIAGNOSIS — Z7901 Long term (current) use of anticoagulants: Secondary | ICD-10-CM

## 2013-12-03 DIAGNOSIS — Z5181 Encounter for therapeutic drug level monitoring: Secondary | ICD-10-CM

## 2013-12-03 DIAGNOSIS — I4891 Unspecified atrial fibrillation: Secondary | ICD-10-CM

## 2013-12-03 LAB — POCT INR: INR: 2.8

## 2013-12-24 ENCOUNTER — Ambulatory Visit (INDEPENDENT_AMBULATORY_CARE_PROVIDER_SITE_OTHER): Payer: Medicare Other | Admitting: *Deleted

## 2013-12-24 DIAGNOSIS — Z5181 Encounter for therapeutic drug level monitoring: Secondary | ICD-10-CM

## 2013-12-24 DIAGNOSIS — Z7901 Long term (current) use of anticoagulants: Secondary | ICD-10-CM

## 2013-12-24 DIAGNOSIS — I4891 Unspecified atrial fibrillation: Secondary | ICD-10-CM

## 2013-12-24 LAB — POCT INR: INR: 3.5

## 2013-12-29 ENCOUNTER — Other Ambulatory Visit: Payer: Self-pay | Admitting: Internal Medicine

## 2014-01-14 ENCOUNTER — Ambulatory Visit (INDEPENDENT_AMBULATORY_CARE_PROVIDER_SITE_OTHER): Payer: Medicare Other | Admitting: *Deleted

## 2014-01-14 DIAGNOSIS — Z5181 Encounter for therapeutic drug level monitoring: Secondary | ICD-10-CM

## 2014-01-14 DIAGNOSIS — I4891 Unspecified atrial fibrillation: Secondary | ICD-10-CM

## 2014-01-14 DIAGNOSIS — Z7901 Long term (current) use of anticoagulants: Secondary | ICD-10-CM

## 2014-01-14 LAB — POCT INR: INR: 4.7

## 2014-01-28 ENCOUNTER — Ambulatory Visit (INDEPENDENT_AMBULATORY_CARE_PROVIDER_SITE_OTHER): Payer: Medicare Other | Admitting: *Deleted

## 2014-01-28 DIAGNOSIS — Z7901 Long term (current) use of anticoagulants: Secondary | ICD-10-CM

## 2014-01-28 DIAGNOSIS — Z5181 Encounter for therapeutic drug level monitoring: Secondary | ICD-10-CM

## 2014-01-28 DIAGNOSIS — I4891 Unspecified atrial fibrillation: Secondary | ICD-10-CM

## 2014-01-28 LAB — POCT INR: INR: 3.1

## 2014-01-31 ENCOUNTER — Other Ambulatory Visit: Payer: Self-pay | Admitting: Internal Medicine

## 2014-01-31 DIAGNOSIS — I4891 Unspecified atrial fibrillation: Secondary | ICD-10-CM

## 2014-02-18 ENCOUNTER — Ambulatory Visit (INDEPENDENT_AMBULATORY_CARE_PROVIDER_SITE_OTHER): Payer: Medicare Other | Admitting: *Deleted

## 2014-02-18 DIAGNOSIS — Z7901 Long term (current) use of anticoagulants: Secondary | ICD-10-CM

## 2014-02-18 DIAGNOSIS — Z5181 Encounter for therapeutic drug level monitoring: Secondary | ICD-10-CM

## 2014-02-18 DIAGNOSIS — I4891 Unspecified atrial fibrillation: Secondary | ICD-10-CM

## 2014-02-18 LAB — POCT INR: INR: 3.2

## 2014-03-11 ENCOUNTER — Ambulatory Visit (INDEPENDENT_AMBULATORY_CARE_PROVIDER_SITE_OTHER): Payer: Medicare Other | Admitting: *Deleted

## 2014-03-11 DIAGNOSIS — I4891 Unspecified atrial fibrillation: Secondary | ICD-10-CM

## 2014-03-11 DIAGNOSIS — Z5181 Encounter for therapeutic drug level monitoring: Secondary | ICD-10-CM

## 2014-03-11 DIAGNOSIS — Z7901 Long term (current) use of anticoagulants: Secondary | ICD-10-CM

## 2014-03-11 LAB — POCT INR: INR: 2.7

## 2014-04-08 ENCOUNTER — Ambulatory Visit (INDEPENDENT_AMBULATORY_CARE_PROVIDER_SITE_OTHER): Payer: Medicare Other | Admitting: *Deleted

## 2014-04-08 DIAGNOSIS — Z7901 Long term (current) use of anticoagulants: Secondary | ICD-10-CM

## 2014-04-08 DIAGNOSIS — I4891 Unspecified atrial fibrillation: Secondary | ICD-10-CM

## 2014-04-08 DIAGNOSIS — Z5181 Encounter for therapeutic drug level monitoring: Secondary | ICD-10-CM

## 2014-04-08 DIAGNOSIS — I482 Chronic atrial fibrillation, unspecified: Secondary | ICD-10-CM

## 2014-04-08 LAB — POCT INR: INR: 4.4

## 2014-04-15 ENCOUNTER — Ambulatory Visit (INDEPENDENT_AMBULATORY_CARE_PROVIDER_SITE_OTHER): Payer: Medicare Other | Admitting: *Deleted

## 2014-04-15 DIAGNOSIS — I4819 Other persistent atrial fibrillation: Secondary | ICD-10-CM

## 2014-04-15 DIAGNOSIS — I4891 Unspecified atrial fibrillation: Secondary | ICD-10-CM

## 2014-04-15 DIAGNOSIS — Z5181 Encounter for therapeutic drug level monitoring: Secondary | ICD-10-CM

## 2014-04-15 DIAGNOSIS — Z7901 Long term (current) use of anticoagulants: Secondary | ICD-10-CM

## 2014-04-15 LAB — POCT INR: INR: 2

## 2014-04-25 ENCOUNTER — Ambulatory Visit (INDEPENDENT_AMBULATORY_CARE_PROVIDER_SITE_OTHER): Payer: Medicare Other | Admitting: Cardiology

## 2014-04-25 ENCOUNTER — Encounter: Payer: Self-pay | Admitting: Cardiology

## 2014-04-25 VITALS — BP 117/70 | HR 71

## 2014-04-25 DIAGNOSIS — E785 Hyperlipidemia, unspecified: Secondary | ICD-10-CM

## 2014-04-25 DIAGNOSIS — I2510 Atherosclerotic heart disease of native coronary artery without angina pectoris: Secondary | ICD-10-CM

## 2014-04-25 DIAGNOSIS — I4891 Unspecified atrial fibrillation: Secondary | ICD-10-CM

## 2014-04-25 NOTE — Patient Instructions (Signed)
   Stop Aspirin Continue all other medications.   Your physician wants you to follow up in: 6 months.  You will receive a reminder letter in the mail one-two months in advance.  If you don't receive a letter, please call our office to schedule the follow up appointment   

## 2014-04-25 NOTE — Progress Notes (Signed)
Clinical Summary [name] is a 73 y.o.male seen today for follow up of the following medical problems.  1. CAD/ICM  - prior stenting to RCA in 2010. LVEF 50-55% on last echo per clinic notes, this was a bedside echo peformed Jan 2013   - denies any chest pain since last visit. No SOB or DOE. Fairly sedentary lifestyle, denies significant DOE at his normal level of activity which is unchanged.  - no orthopnea, no PND, no LE edema  - compliant with meds   2. Afib  - followed by Dr Johney FrameAllred in EP clinic  - denies any symptoms recently   3. History of syncope  - no current symptoms  - patient has a loop recorder implanted sometime ago, he has elected to keep it in place as oppose to have it removed.   4. Hyperlipidemia  - reports lipids checked regularly at dialysis, no recent numbers in our system. He did not bring lab results with him today.  - compliant with atorva  Jan 2015 panel: TC 70 TG 75 HDL 30 LDL 25  Past Medical History  Diagnosis Date  . CAD (coronary artery disease)     a. abnormal perfusion imaging study with multipe, large fixed  defects, EF39% b. EF 50-55% with apical akinesis by 2D echo. Status post coronary intervention with drug-eluting stent placement x3 October 2010 EF 40-45%   . Ventricular ectopy     History of nonsustained ventricular tachycardia., Status post EP study negative for inducible ventricular tachycardia  . Anemia     chronic  . Renal failure     chronic, on hemodialysis  . Hypothyroidism   . Diabetes mellitus   . HTN (hypertension)   . Hemodialysis adequacy testing     through a Vas-cath catheer  . Paroxysmal atrial fibrillation   . Syncope     s/p ILR no recurrent syncope and negative for arrhythmias last interrogation December 2012     No Known Allergies   Current Outpatient Prescriptions  Medication Sig Dispense Refill  . aspirin 81 MG tablet Take 81 mg by mouth daily.      Marland Kitchen. atorvastatin (LIPITOR) 80 MG tablet Take 1 tablet  (80 mg total) by mouth every evening.  90 tablet  3  . Calcium Acetate 667 MG TABS Take 2 tablets by mouth 2 (two) times daily with a meal. & 1 with snacks      . folic acid-vitamin b complex-vitamin c-selenium-zinc (DIALYVITE) 3 MG TABS Take 1 tablet by mouth daily.        Marland Kitchen. levothyroxine (LEVOTHROID) 175 MCG tablet Take 175 mcg by mouth daily.        . metoprolol succinate (TOPROL-XL) 50 MG 24 hr tablet TAKE 1 BY MOUTH DAILY      TAKE WITH OR IMMEDIATELY   FOLLOWING A MEAL  90 tablet  3  . NOVOLIN N 100 UNIT/ML injection Inject 40 Units into the skin as directed.       Marland Kitchen. omeprazole (PRILOSEC) 20 MG capsule Take 20 mg by mouth daily.       . polyethylene glycol powder (MIRALAX) powder Take 17 g by mouth as needed.       . SENSIPAR 30 MG tablet Take 1 tablet by mouth daily.      . sevelamer carbonate (RENVELA) 2.4 G PACK 2.4 g 3 (three) times daily with meals.      . warfarin (COUMADIN) 5 MG tablet Sun  Mon           Tue       Wed            Thu           Fri                 Sat   2.5 mg     2.5 mg      2.5 mg      2.5 mg       5 mg        2.5 mg         2.5 mg  30 tablet  1  . warfarin (COUMADIN) 5 MG tablet TAKE ONE TABLET BY MOUTH AS DIRECTED  30 tablet  3   No current facility-administered medications for this visit.     Past Surgical History  Procedure Laterality Date  . Cataract extraction    . Cholecystectomy    . Implantable loop recorder       No Known Allergies    Family History  Problem Relation Age of Onset  . Diabetes Other   . Hypertension Other      Social History [name] reports that he quit smoking about 27 years ago. His smoking use included Cigars. He has never used smokeless tobacco. [name] reports that he does not drink alcohol.   Review of Systems CONSTITUTIONAL: No weight loss, fever, chills, weakness or fatigue.  HEENT: Eyes: No visual loss, blurred vision, double vision or yellow sclerae.No hearing loss, sneezing, congestion, runny  nose or sore throat.  SKIN: No rash or itching.  CARDIOVASCULAR: per HPI RESPIRATORY: No shortness of breath, cough or sputum.  GASTROINTESTINAL: No anorexia, nausea, vomiting or diarrhea. No abdominal pain or blood.  GENITOURINARY: No burning on urination, no polyuria NEUROLOGICAL: No headache, dizziness, syncope, paralysis, ataxia, numbness or tingling in the extremities. No change in bowel or bladder control.  MUSCULOSKELETAL: No muscle, back pain, joint pain or stiffness.  LYMPHATICS: No enlarged nodes. No history of splenectomy.  PSYCHIATRIC: No history of depression or anxiety.  ENDOCRINOLOGIC: No reports of sweating, cold or heat intolerance. No polyuria or polydipsia.  Marland Kitchen.   Physical Examination p 71 bp 117/70  Gen: resting comfortably, no acute distress HEENT: no scleral icterus, pupils equal round and reactive, no palptable cervical adenopathy,  CV: irreg, no m/r/g, no JVD, no carotid bruits Resp: Clear to auscultation bilaterally GI: abdomen is soft, non-tender, non-distended, normal bowel sounds, no hepatosplenomegaly MSK: extremities are warm, no edema.  Skin: warm, no rash Neuro:  no focal deficits Psych: appropriate affect   Diagnostic Studies 07/2009 Echo: :LVEF 35-40%, mild LVH, apical akinesis, grade I diastolic dyfuncttion, mod LAE  07/2009 Cath:  LAD occlusion, severe RCA disease   April 25, 2014 EKG Aflutter variable block, inferior Q waves.   Assessment and Plan  1. CAD  - no current symptoms  - reports some low blood pressures during dialysis, will not start ACE-I at this time. Continue current meds   2. Afib/Aflutter -no current symptoms  - continue rate control with Toprol, continue regular EP follow up  - continue coumadin for stroke prophylaxis   3. Syncope  - no recent episodes  - has loop recorder with run down battery, prefers to not go through procedure for removal at this time   4. Hyperlipidemia  - at goal, continue current statin.         Antoine PocheJonathan F. Zyhir Cappella, M.D., F.A.C.C.

## 2014-05-06 ENCOUNTER — Ambulatory Visit (INDEPENDENT_AMBULATORY_CARE_PROVIDER_SITE_OTHER): Payer: Medicare Other | Admitting: *Deleted

## 2014-05-06 DIAGNOSIS — Z7901 Long term (current) use of anticoagulants: Secondary | ICD-10-CM

## 2014-05-06 DIAGNOSIS — Z5181 Encounter for therapeutic drug level monitoring: Secondary | ICD-10-CM

## 2014-05-06 DIAGNOSIS — I4891 Unspecified atrial fibrillation: Secondary | ICD-10-CM

## 2014-05-06 LAB — POCT INR: INR: 2.2

## 2014-05-16 ENCOUNTER — Other Ambulatory Visit: Payer: Self-pay | Admitting: *Deleted

## 2014-05-16 MED ORDER — ATORVASTATIN CALCIUM 80 MG PO TABS: 80.0000 mg | ORAL_TABLET | Freq: Every evening | ORAL | Status: DC

## 2014-05-17 ENCOUNTER — Encounter: Payer: Self-pay | Admitting: *Deleted

## 2014-05-27 ENCOUNTER — Ambulatory Visit (INDEPENDENT_AMBULATORY_CARE_PROVIDER_SITE_OTHER): Payer: Medicare Other | Admitting: *Deleted

## 2014-05-27 DIAGNOSIS — Z7901 Long term (current) use of anticoagulants: Secondary | ICD-10-CM

## 2014-05-27 DIAGNOSIS — Z5181 Encounter for therapeutic drug level monitoring: Secondary | ICD-10-CM

## 2014-05-27 DIAGNOSIS — I4891 Unspecified atrial fibrillation: Secondary | ICD-10-CM

## 2014-05-27 LAB — POCT INR: INR: 3.1

## 2014-06-17 ENCOUNTER — Ambulatory Visit (INDEPENDENT_AMBULATORY_CARE_PROVIDER_SITE_OTHER): Payer: Medicare Other | Admitting: *Deleted

## 2014-06-17 DIAGNOSIS — I4891 Unspecified atrial fibrillation: Secondary | ICD-10-CM

## 2014-06-17 DIAGNOSIS — Z5181 Encounter for therapeutic drug level monitoring: Secondary | ICD-10-CM

## 2014-06-17 DIAGNOSIS — Z7901 Long term (current) use of anticoagulants: Secondary | ICD-10-CM

## 2014-06-17 LAB — POCT INR: INR: 2

## 2014-06-17 MED ORDER — WARFARIN SODIUM 2 MG PO TABS: ORAL_TABLET | ORAL | Status: DC

## 2014-07-08 ENCOUNTER — Ambulatory Visit (INDEPENDENT_AMBULATORY_CARE_PROVIDER_SITE_OTHER): Payer: Medicare Other | Admitting: *Deleted

## 2014-07-08 DIAGNOSIS — Z7901 Long term (current) use of anticoagulants: Secondary | ICD-10-CM

## 2014-07-08 DIAGNOSIS — I4891 Unspecified atrial fibrillation: Secondary | ICD-10-CM

## 2014-07-08 DIAGNOSIS — Z5181 Encounter for therapeutic drug level monitoring: Secondary | ICD-10-CM

## 2014-07-08 LAB — POCT INR: INR: 3.3

## 2014-07-29 ENCOUNTER — Ambulatory Visit (INDEPENDENT_AMBULATORY_CARE_PROVIDER_SITE_OTHER): Payer: Medicare Other

## 2014-07-29 DIAGNOSIS — Z7901 Long term (current) use of anticoagulants: Secondary | ICD-10-CM

## 2014-07-29 DIAGNOSIS — Z5181 Encounter for therapeutic drug level monitoring: Secondary | ICD-10-CM

## 2014-07-29 DIAGNOSIS — I4891 Unspecified atrial fibrillation: Secondary | ICD-10-CM

## 2014-07-29 LAB — POCT INR: INR: 3.3

## 2014-08-18 ENCOUNTER — Ambulatory Visit (INDEPENDENT_AMBULATORY_CARE_PROVIDER_SITE_OTHER): Payer: Medicare Other | Admitting: *Deleted

## 2014-08-18 DIAGNOSIS — I4891 Unspecified atrial fibrillation: Secondary | ICD-10-CM

## 2014-08-18 DIAGNOSIS — Z7901 Long term (current) use of anticoagulants: Secondary | ICD-10-CM

## 2014-08-18 DIAGNOSIS — Z5181 Encounter for therapeutic drug level monitoring: Secondary | ICD-10-CM

## 2014-08-18 LAB — POCT INR: INR: 2.7

## 2014-09-08 ENCOUNTER — Ambulatory Visit (INDEPENDENT_AMBULATORY_CARE_PROVIDER_SITE_OTHER): Payer: Medicare Other | Admitting: *Deleted

## 2014-09-08 DIAGNOSIS — I4891 Unspecified atrial fibrillation: Secondary | ICD-10-CM

## 2014-09-08 DIAGNOSIS — Z7901 Long term (current) use of anticoagulants: Secondary | ICD-10-CM

## 2014-09-08 DIAGNOSIS — Z5181 Encounter for therapeutic drug level monitoring: Secondary | ICD-10-CM

## 2014-09-08 LAB — POCT INR: INR: 3.1

## 2014-09-29 ENCOUNTER — Ambulatory Visit (INDEPENDENT_AMBULATORY_CARE_PROVIDER_SITE_OTHER): Payer: Medicare Other | Admitting: *Deleted

## 2014-09-29 DIAGNOSIS — Z5181 Encounter for therapeutic drug level monitoring: Secondary | ICD-10-CM

## 2014-09-29 DIAGNOSIS — I4891 Unspecified atrial fibrillation: Secondary | ICD-10-CM

## 2014-09-29 DIAGNOSIS — Z7901 Long term (current) use of anticoagulants: Secondary | ICD-10-CM

## 2014-09-29 LAB — POCT INR: INR: 4.3

## 2014-10-18 ENCOUNTER — Ambulatory Visit (INDEPENDENT_AMBULATORY_CARE_PROVIDER_SITE_OTHER): Payer: Medicare Other | Admitting: *Deleted

## 2014-10-18 DIAGNOSIS — Z5181 Encounter for therapeutic drug level monitoring: Secondary | ICD-10-CM

## 2014-10-18 DIAGNOSIS — Z7901 Long term (current) use of anticoagulants: Secondary | ICD-10-CM

## 2014-10-18 DIAGNOSIS — I4891 Unspecified atrial fibrillation: Secondary | ICD-10-CM

## 2014-10-18 LAB — POCT INR: INR: 3.3

## 2014-11-08 ENCOUNTER — Ambulatory Visit (INDEPENDENT_AMBULATORY_CARE_PROVIDER_SITE_OTHER): Payer: Medicare Other | Admitting: *Deleted

## 2014-11-08 DIAGNOSIS — Z5181 Encounter for therapeutic drug level monitoring: Secondary | ICD-10-CM

## 2014-11-08 DIAGNOSIS — Z7901 Long term (current) use of anticoagulants: Secondary | ICD-10-CM

## 2014-11-08 DIAGNOSIS — I4891 Unspecified atrial fibrillation: Secondary | ICD-10-CM

## 2014-11-08 LAB — POCT INR: INR: 3.7

## 2014-11-23 ENCOUNTER — Encounter: Payer: Self-pay | Admitting: Cardiology

## 2014-11-23 ENCOUNTER — Encounter: Payer: Self-pay | Admitting: *Deleted

## 2014-11-23 ENCOUNTER — Telehealth: Payer: Self-pay | Admitting: *Deleted

## 2014-11-23 ENCOUNTER — Ambulatory Visit (INDEPENDENT_AMBULATORY_CARE_PROVIDER_SITE_OTHER): Payer: Medicare Other | Admitting: Cardiology

## 2014-11-23 ENCOUNTER — Ambulatory Visit (INDEPENDENT_AMBULATORY_CARE_PROVIDER_SITE_OTHER): Payer: Medicare Other | Admitting: *Deleted

## 2014-11-23 VITALS — BP 123/77 | HR 82 | Ht 71.0 in | Wt 208.0 lb

## 2014-11-23 DIAGNOSIS — I4892 Unspecified atrial flutter: Secondary | ICD-10-CM

## 2014-11-23 DIAGNOSIS — I2510 Atherosclerotic heart disease of native coronary artery without angina pectoris: Secondary | ICD-10-CM

## 2014-11-23 DIAGNOSIS — E785 Hyperlipidemia, unspecified: Secondary | ICD-10-CM

## 2014-11-23 DIAGNOSIS — I480 Paroxysmal atrial fibrillation: Secondary | ICD-10-CM

## 2014-11-23 DIAGNOSIS — Z5181 Encounter for therapeutic drug level monitoring: Secondary | ICD-10-CM

## 2014-11-23 DIAGNOSIS — I4891 Unspecified atrial fibrillation: Secondary | ICD-10-CM

## 2014-11-23 DIAGNOSIS — Z7901 Long term (current) use of anticoagulants: Secondary | ICD-10-CM

## 2014-11-23 DIAGNOSIS — R0602 Shortness of breath: Secondary | ICD-10-CM

## 2014-11-23 LAB — POCT INR: INR: 1.7

## 2014-11-23 NOTE — Progress Notes (Signed)
Patient ID: Salli QuarryJames G Mccosh, male   DOB: 09-Sep-1941, 74 y.o.   MRN: [id number]     Clinical Summary [name] is a 74 y.o.male seen today for follow up of the following medical problems.   1. CAD/ICM  - prior stenting to RCA in 2010. LVEF 50-55% on last echo per clinic notes, this was a bedside echo peformed Jan 2013 by Dr Andee LinemaneGent.  - denies any chest pain. Notes some SOB at times, somewhat worst over the last few months. Denies any LE edema, no orthopnea, no PND.    2. Afib  - followed by Dr Johney FrameAllred in EP clinic  - denies any palpitaions. No bleeding issues on coumadin  3. History of syncope  - no current symptoms  - patient has a loop recorder implanted sometime ago, he has elected to keep it in place as oppose to have it removed.   4. Hyperlipidemia  Jan 2015 panel: TC 70 TG 75 HDL 30 LDL 25 - compliant with high dose statin   5. ESRD - on HD    Past Medical History  Diagnosis Date  . CAD (coronary artery disease)     a. abnormal perfusion imaging study with multipe, large fixed  defects, EF39% b. EF 50-55% with apical akinesis by 2D echo. Status post coronary intervention with drug-eluting stent placement x3 October 2010 EF 40-45%   . Ventricular ectopy     History of nonsustained ventricular tachycardia., Status post EP study negative for inducible ventricular tachycardia  . Anemia     chronic  . Renal failure     chronic, on hemodialysis  . Hypothyroidism   . Diabetes mellitus   . HTN (hypertension)   . Hemodialysis adequacy testing     through a Vas-cath catheer  . Paroxysmal atrial fibrillation   . Syncope     s/p ILR no recurrent syncope and negative for arrhythmias last interrogation December 2012     No Known Allergies   Current Outpatient Prescriptions  Medication Sig Dispense Refill  . atorvastatin (LIPITOR) 80 MG tablet Take 1 tablet (80 mg total) by mouth every evening. 90 tablet 3  . Calcium Acetate 667 MG TABS Take 2 tablets by mouth 2 (two)  times daily with a meal. & 1 with snacks    . folic acid-vitamin b complex-vitamin c-selenium-zinc (DIALYVITE) 3 MG TABS Take 1 tablet by mouth daily.      Marland Kitchen. levothyroxine (LEVOTHROID) 175 MCG tablet Take 175 mcg by mouth daily.      . metoprolol succinate (TOPROL-XL) 50 MG 24 hr tablet TAKE 1 BY MOUTH DAILY      TAKE WITH OR IMMEDIATELY   FOLLOWING A MEAL 90 tablet 3  . NOVOLIN N 100 UNIT/ML injection Inject 40 Units into the skin as directed.     Marland Kitchen. omeprazole (PRILOSEC) 20 MG capsule Take 20 mg by mouth daily.     . polyethylene glycol powder (MIRALAX) powder Take 17 g by mouth as needed.     . SENSIPAR 30 MG tablet Take 1 tablet by mouth daily.    . sevelamer carbonate (RENVELA) 2.4 G PACK 2.4 g 3 (three) times daily with meals.    . warfarin (COUMADIN) 2 MG tablet Take 1 tablet daily or as directed 45 tablet 3   No current facility-administered medications for this visit.     Past Surgical History  Procedure Laterality Date  . Cataract extraction    . Cholecystectomy    . Implantable loop recorder  No Known Allergies    Family History  Problem Relation Age of Onset  . Diabetes Other   . Hypertension Other      Social History Mr. Weller reports that he quit smoking about 28 years ago. His smoking use included Cigars. He has never used smokeless tobacco. Mr. Citro reports that he does not drink alcohol.   Review of Systems CONSTITUTIONAL: No weight loss, fever, chills, weakness or fatigue.  HEENT: Eyes: No visual loss, blurred vision, double vision or yellow sclerae.No hearing loss, sneezing, congestion, runny nose or sore throat.  SKIN: No rash or itching.  CARDIOVASCULAR: per HPI RESPIRATORY: No shortness of breath, cough or sputum.  GASTROINTESTINAL: No anorexia, nausea, vomiting or diarrhea. No abdominal pain or blood.  GENITOURINARY: No burning on urination, no polyuria NEUROLOGICAL: No headache, dizziness, syncope, paralysis, ataxia, numbness or tingling in  the extremities. No change in bowel or bladder control.  MUSCULOSKELETAL: No muscle, back pain, joint pain or stiffness.  LYMPHATICS: No enlarged nodes. No history of splenectomy.  PSYCHIATRIC: No history of depression or anxiety.  ENDOCRINOLOGIC: No reports of sweating, cold or heat intolerance. No polyuria or polydipsia.  Marland Kitchen   Physical Examination p 82 bp 123/77 Wt 208 lbs BMI 29 Gen: resting comfortably, no acute distress HEENT: no scleral icterus, pupils equal round and reactive, no palptable cervical adenopathy,  ZO:[id number], no m/r/g, no JVD, no carotid bruits Resp: Clear to auscultation bilaterally GI: abdomen is soft, non-tender, non-distended, normal bowel sounds, no hepatosplenomegaly MSK: extremities are warm, no edema.  Skin: warm, no rash Neuro:  no focal deficits Psych: appropriate affect   Diagnostic Studies 07/2009 Echo: :LVEF 35-40%, mild LVH, apical akinesis, grade I diastolic dyfuncttion, mod LAE  07/2009 Cath:  LAD occlusion, severe RCA disease    Assessment and Plan  1. CAD  - no chest pain but does note some increased SOB. Will obtain echo, last study 3 years ago unofficial bedside study by Dr Andee Lineman.  - medical therapy has been somewhat limited due to low bp's during dialysis, have not started an ACE - continue current meds  2. Afib/Aflutter -no current symptoms  - continue rate control with Toprol, continue regular EP follow up  - continue coumadin for stroke prophylaxis   3. Syncope  - no recent episodes  - has loop recorder with run down battery, prefers to not go through procedure for removal at this time   4. Hyperlipidemia  - at goal, continue current statin.      F/u 6 months     Antoine Poche, M.D

## 2014-11-23 NOTE — Telephone Encounter (Signed)
Received labs from [facility], on Dr. Verna CzechBranch's desk

## 2014-11-23 NOTE — Patient Instructions (Signed)
Your physician wants you to follow-up in: 6 months with Dr. Lurena JoinerBranch You will receive a reminder letter in the mail two months in advance. If you don't receive a letter, please call our office to schedule the follow-up appointment.  Your physician recommends that you continue on your current medications as directed. Please refer to the Current Medication list given to you today.  Your physician has requested that you have an echocardiogram. Echocardiography is a painless test that uses sound waves to create images of your heart. It provides your doctor with information about the size and shape of your heart and how well your heart's chambers and valves are working. This procedure takes approximately one hour. There are no restrictions for this procedure.  [facility]  We will request labs from Dr. Carlyle Dollyivita   [facility] you [facility] choosing [facility]!!

## 2014-11-30 ENCOUNTER — Other Ambulatory Visit: Payer: Self-pay | Admitting: Cardiology

## 2014-11-30 ENCOUNTER — Other Ambulatory Visit (INDEPENDENT_AMBULATORY_CARE_PROVIDER_SITE_OTHER): Payer: Medicare Other

## 2014-11-30 ENCOUNTER — Other Ambulatory Visit: Payer: Self-pay

## 2014-11-30 DIAGNOSIS — R0602 Shortness of breath: Secondary | ICD-10-CM

## 2014-11-30 DIAGNOSIS — I4892 Unspecified atrial flutter: Secondary | ICD-10-CM

## 2014-11-30 DIAGNOSIS — I2510 Atherosclerotic heart disease of native coronary artery without angina pectoris: Secondary | ICD-10-CM

## 2014-12-01 ENCOUNTER — Telehealth: Payer: Self-pay | Admitting: *Deleted

## 2014-12-01 NOTE — Telephone Encounter (Signed)
Pt made aware, forwarded to Dr. Margo Commontapper

## 2014-12-01 NOTE — Telephone Encounter (Signed)
-----   Message from Antoine PocheJonathan F Branch, MD sent at 12/01/2014 11:04 AM EST ----- Heart function ovearll by echo is good.  Dominga FerryJ Branch MD

## 2014-12-13 ENCOUNTER — Ambulatory Visit (INDEPENDENT_AMBULATORY_CARE_PROVIDER_SITE_OTHER): Payer: Medicare Other | Admitting: *Deleted

## 2014-12-13 DIAGNOSIS — I4891 Unspecified atrial fibrillation: Secondary | ICD-10-CM | POA: Diagnosis not present

## 2014-12-13 DIAGNOSIS — Z7901 Long term (current) use of anticoagulants: Secondary | ICD-10-CM | POA: Diagnosis not present

## 2014-12-13 DIAGNOSIS — I480 Paroxysmal atrial fibrillation: Secondary | ICD-10-CM

## 2014-12-13 DIAGNOSIS — Z5181 Encounter for therapeutic drug level monitoring: Secondary | ICD-10-CM

## 2014-12-13 LAB — POCT INR: INR: 2.2

## 2015-01-03 ENCOUNTER — Ambulatory Visit (INDEPENDENT_AMBULATORY_CARE_PROVIDER_SITE_OTHER): Payer: Medicare Other | Admitting: *Deleted

## 2015-01-03 DIAGNOSIS — I480 Paroxysmal atrial fibrillation: Secondary | ICD-10-CM

## 2015-01-03 DIAGNOSIS — Z7901 Long term (current) use of anticoagulants: Secondary | ICD-10-CM

## 2015-01-03 DIAGNOSIS — I4891 Unspecified atrial fibrillation: Secondary | ICD-10-CM

## 2015-01-03 DIAGNOSIS — Z5181 Encounter for therapeutic drug level monitoring: Secondary | ICD-10-CM | POA: Diagnosis not present

## 2015-01-03 LAB — POCT INR: INR: 2.1

## 2015-01-31 ENCOUNTER — Ambulatory Visit (INDEPENDENT_AMBULATORY_CARE_PROVIDER_SITE_OTHER): Payer: Medicare Other | Admitting: *Deleted

## 2015-01-31 DIAGNOSIS — Z7901 Long term (current) use of anticoagulants: Secondary | ICD-10-CM

## 2015-01-31 DIAGNOSIS — Z5181 Encounter for therapeutic drug level monitoring: Secondary | ICD-10-CM

## 2015-01-31 DIAGNOSIS — I4891 Unspecified atrial fibrillation: Secondary | ICD-10-CM | POA: Diagnosis not present

## 2015-01-31 LAB — POCT INR: INR: 2.1

## 2015-02-06 ENCOUNTER — Other Ambulatory Visit: Payer: Self-pay | Admitting: *Deleted

## 2015-02-06 MED ORDER — WARFARIN SODIUM 2 MG PO TABS: ORAL_TABLET | ORAL | Status: DC

## 2015-02-10 ENCOUNTER — Encounter: Payer: Self-pay | Admitting: Vascular Surgery

## 2015-02-13 ENCOUNTER — Ambulatory Visit (INDEPENDENT_AMBULATORY_CARE_PROVIDER_SITE_OTHER): Payer: Medicare Other | Admitting: Vascular Surgery

## 2015-02-13 ENCOUNTER — Encounter: Payer: Self-pay | Admitting: Vascular Surgery

## 2015-02-13 VITALS — BP 116/70 | HR 95 | Resp 18 | Ht 71.0 in | Wt 209.0 lb

## 2015-02-13 DIAGNOSIS — L97909 Non-pressure chronic ulcer of unspecified part of unspecified lower leg with unspecified severity: Secondary | ICD-10-CM | POA: Diagnosis not present

## 2015-02-13 DIAGNOSIS — I70299 Other atherosclerosis of native arteries of extremities, unspecified extremity: Secondary | ICD-10-CM

## 2015-02-13 NOTE — Progress Notes (Signed)
Referred by:  Oley Balmavid B. Margo Commonapper, MD [address] [place], [place] [id number]  Reason for referral: left heel ulcer  History of Present Illness  Matthew Espinoza is a 74 y.o. (Oct 04, 1941) male who presents with chief complaint: left heel ulcer.  Onset of symptom occurred ~1.5 month, onset of left heel ulcer.  Exact mech of injury unknown.  Patient has been soaking the foot 20 minutes a day in vinegar and epsom salt.  Patient notes minimal pain.  He does not get rest pain and does not have intermittent claudication as his ambulation is limited by dyspnea upon exertion.  Atherosclerotic risk factors include: HTN, DM, prior smoking.  Past Medical History  Diagnosis Date  . CAD (coronary artery disease)     a. abnormal perfusion imaging study with multipe, large fixed  defects, EF39% b. EF 50-55% with apical akinesis by 2D echo. Status post coronary intervention with drug-eluting stent placement x3 October 2010 EF 40-45%   . Ventricular ectopy     History of nonsustained ventricular tachycardia., Status post EP study negative for inducible ventricular tachycardia  . Anemia     chronic  . Renal failure     chronic, on hemodialysis  . Hypothyroidism   . Diabetes mellitus   . HTN (hypertension)   . Hemodialysis adequacy testing     through a Vas-cath catheer  . Paroxysmal atrial fibrillation   . Syncope     s/p ILR no recurrent syncope and negative for arrhythmias last interrogation December 2012    Past Surgical History  Procedure Laterality Date  . Cataract extraction    . Cholecystectomy    . Implantable loop recorder    . Cardiac stents      History   Social History  . Marital Status: Divorced    Spouse Name: N/A  . Number of Children: N/A  . Years of Education: N/A   Occupational History  . Not on file.   Social History Main Topics  . Smoking status: Former Smoker -- 2.00 packs/day for 20 years    Types: Cigars    Start date: 10/21/1966    Quit date: 10/21/1986  .  Smokeless tobacco: Never Used     Comment: Smoked cigars but quit smoking in 1987 after smoking for 20 yrs.   . Alcohol Use: No  . Drug Use: No  . Sexual Activity: Not on file   Other Topics Concern  . Not on file   Social History Narrative   Widowed. Retired (worked Haematologistoperating a Arboriculturisthardware store)     Family History  Problem Relation Age of Onset  . Diabetes Other   . Hypertension Other   . Diabetes Mother   . Diabetes Father   . Diabetes Sister   . Hypertension Sister   . Cancer Brother   . Varicose Veins Daughter     Current Outpatient Prescriptions on File Prior to Visit  Medication Sig Dispense Refill  . atorvastatin (LIPITOR) 80 MG tablet Take 1 tablet (80 mg total) by mouth every evening. 90 tablet 3  . Calcium Acetate 667 MG TABS Take 2 tablets by mouth 2 (two) times daily with a meal. & 1 with snacks    . folic acid-vitamin b complex-vitamin c-selenium-zinc (DIALYVITE) 3 MG TABS Take 1 tablet by mouth daily.      Marland Kitchen. levothyroxine (LEVOTHROID) 175 MCG tablet Take 175 mcg by mouth daily.      . metoprolol succinate (TOPROL-XL) 50 MG 24 hr tablet  TAKE 1 BY MOUTH DAILY      TAKE WITH OR IMMEDIATELY   FOLLOWING A MEAL. 90 tablet 3  . NOVOLIN N 100 UNIT/ML injection Inject 40 Units into the skin as directed.     Marland Kitchen omeprazole (PRILOSEC) 20 MG capsule Take 20 mg by mouth daily.     . polyethylene glycol powder (MIRALAX) powder Take 17 g by mouth as needed.     . SENSIPAR 30 MG tablet Take 1 tablet by mouth daily.    . sevelamer carbonate (RENVELA) 2.4 G PACK 2.4 g 3 (three) times daily with meals.    . warfarin (COUMADIN) 2 MG tablet Take 1 tablet daily or as directed 45 tablet 3  . permethrin (ELIMITE) 5 % cream Apply 1 application topically as directed.  0  . triamcinolone cream (KENALOG) 0.1 % Apply 1 application topically as needed.  3   No current facility-administered medications on file prior to visit.    No Known Allergies    REVIEW OF SYSTEMS:  (Positives checked  otherwise negative)  CARDIOVASCULAR:   chest pain,  chest pressure,  palpitations,  shortness of breath when laying flat,  shortness of breath with exertion,   pain in feet when walking,  pain in feet when laying flat,  history of blood clot in veins (DVT),  history of phlebitis,  swelling in legs,  varicose veins  PULMONARY:   productive cough,  asthma,  wheezing  NEUROLOGIC:   weakness in arms or legs,  numbness in arms or legs,  difficulty speaking or slurred speech,  temporary loss of vision in one eye,  dizziness  HEMATOLOGIC:   bleeding problems,  problems with blood clotting too easily  MUSCULOSKEL:   joint pain,  joint swelling  GASTROINTEST:   vomiting blood,  blood in stool     GENITOURINARY:   burning with urination,  blood in urine  PSYCHIATRIC:   history of major depression  INTEGUMENTARY:   rashes,  ulcers  CONSTITUTIONAL:   fever,  chills  For VQI Use Only  PRE-ADM LIVING: Home  AMB STATUS: Ambulatory  CAD Sx: None  PRIOR CHF: None  STRESS TEST:  No,  Normal,  + ischemia,  + MI,  Both   Physical Examination  Filed Vitals:   02/13/15 1025  BP: 116/70  Pulse: 95  Resp: 18  Height:  (1.803 m)  Weight: 209 lb (94.802 kg)   Body mass index is 29.16 kg/(m^2).  General: A&O x 3, WDWN  Head: [place]/AT  Ear/Nose/Throat: Hearing grossly intact, nares w/o erythema or drainage, oropharynx w/o Erythema/Exudate, Mallampati score: 3  Eyes: PERRLA, EOMI  Neck: Supple, no nuchal rigidity, no palpable LAD  Pulmonary: Sym exp, good air movt, CTAB, no rales, rhonchi, & wheezing  Cardiac: RRR, Nl S1, S2, no Murmurs, rubs or gallops Vascular: Vessel Right Left  Radial Palpable Palpable  Ulnar Not Palpable Not Palpable  Brachial Palpable Palpable  Carotid Palpable, without bruit Palpable, without bruit  Aorta Not palpable N/A  Femoral Palpable Palpable  Popliteal Not  palpable Not palpable  PT Faintly Palpable Faintly Palpable  DP Not Palpable Not Palpable   Gastrointestinal: soft, NTND, -G/R, - HSM, - masses, - CVAT B  Musculoskeletal: M/S 5/5 throughout , Extremities without ischemic changes , pale ulcer on lateral heel without any drainage or erythema  Neurologic: CN 2-12 intact , Pain and light touch intact in extremities except B feet decreased (L>R), Motor exam as listed above  Psychiatric: Judgment intact, Mood & affect appropriate for pt's clinical situation  Dermatologic: See M/S exam for extremity exam, no rashes otherwise noted  Lymph : No Cervical, Axillary, or Inguinal lymphadenopathy    Non-Invasive Vascular Imaging  Outside ABI (Date: 01/20/15)  R: [place], Fem and pop Tri, tibial mono  L: 0.39. Fem tri, pop and tibial mono  Outside Studies/Documentation 5 pages of outside documents were reviewed including: outpatient PCP chart and outside ABI .  Medical Decision Making  RAQUEL SAYRES is a 74 y.o. 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   I discussed with the patient the natural history of critical limb ischemia: 25% require amputation in one year, 50% are able to maintain their limbs in one year, and 25-30% die in one year due to comorbidities.  Given the limb threatening status of this patient, I recommend an aggressive work up including proceeding with an: Aortogram, Bilateral runoff and ? LLE intervention. I discussed with the patient the nature of angiographic procedures, especially the limited patencies of any endovascular intervention. The patient is aware of that the risks of an angiographic procedure include but are not limited to: bleeding, infection, access site complications, embolization, rupture of treated vessel, dissection, possible need for emergent surgical intervention, and possible need for surgical procedures to treat the patient's pathology. The patient is aware of the  risks and agrees to proceed.  The procedure is scheduled for: 2 MAY 16.  I discussed in depth with the patient the nature of atherosclerosis, and emphasized the importance of maximal medical management including strict control of blood pressure, blood glucose, and lipid levels, antiplatelet agents, obtaining regular exercise, and cessation of smoking.  The patient is aware that without maximal medical management the underlying atherosclerotic disease process will progress, limiting the benefit of any interventions. The patient is currently on a statin:  Lipitor. The patient is currently not on an anti-platelet due to bleeding risk as pt is also on Warfarin.  Thank you for allowing [place] to participate in this patient's care.  Leonides Sake, MD [facility] Office: [phone] Pager: [phone]  02/13/2015, 11:07 AM

## 2015-02-15 ENCOUNTER — Other Ambulatory Visit: Payer: Self-pay

## 2015-02-15 ENCOUNTER — Telehealth: Payer: Self-pay | Admitting: *Deleted

## 2015-02-15 ENCOUNTER — Encounter: Payer: Self-pay | Admitting: Podiatry

## 2015-02-15 NOTE — Telephone Encounter (Signed)
-----   Message from Phillips Odorarol S Pullins, RN sent at 02/15/2015  9:25 AM EDT ----- Regarding: Lorain ChildesFYI / Coumadin on hold This pt. is scheduled for an Aortogram with bilateral runoff and poss. Intervention to left LE 02/20/15.  He has been advised to hold his Coumadin beginning this eve (5 days prior to procedure)

## 2015-02-15 NOTE — Telephone Encounter (Signed)
OK per Dr Wyline MoodBranch.

## 2015-02-20 ENCOUNTER — Ambulatory Visit (HOSPITAL_COMMUNITY)
Admission: RE | Admit: 2015-02-20 | Discharge: 2015-02-20 | Disposition: A | Discharge status: Home / Self Care | Payer: Medicare Other | Source: Ambulatory Visit | Attending: Vascular Surgery | Admitting: Vascular Surgery

## 2015-02-20 ENCOUNTER — Other Ambulatory Visit: Payer: Self-pay

## 2015-02-20 ENCOUNTER — Encounter (HOSPITAL_COMMUNITY): Admission: RE | Payer: Self-pay | Source: Ambulatory Visit

## 2015-02-20 ENCOUNTER — Ambulatory Visit (HOSPITAL_COMMUNITY): Admission: RE | Admit: 2015-02-20 | Payer: Medicare Other | Source: Ambulatory Visit | Admitting: Vascular Surgery

## 2015-02-20 ENCOUNTER — Encounter (HOSPITAL_COMMUNITY)
Admission: RE | Disposition: A | Discharge status: Home / Self Care | Payer: Medicare Other | Source: Ambulatory Visit | Attending: Vascular Surgery

## 2015-02-20 DIAGNOSIS — I700 Atherosclerosis of aorta: Secondary | ICD-10-CM | POA: Diagnosis not present

## 2015-02-20 DIAGNOSIS — Z7952 Long term (current) use of systemic steroids: Secondary | ICD-10-CM | POA: Insufficient documentation

## 2015-02-20 DIAGNOSIS — L97909 Non-pressure chronic ulcer of unspecified part of unspecified lower leg with unspecified severity: Secondary | ICD-10-CM

## 2015-02-20 DIAGNOSIS — Z794 Long term (current) use of insulin: Secondary | ICD-10-CM | POA: Diagnosis not present

## 2015-02-20 DIAGNOSIS — E119 Type 2 diabetes mellitus without complications: Secondary | ICD-10-CM | POA: Insufficient documentation

## 2015-02-20 DIAGNOSIS — I2510 Atherosclerotic heart disease of native coronary artery without angina pectoris: Secondary | ICD-10-CM | POA: Diagnosis not present

## 2015-02-20 DIAGNOSIS — Z79899 Other long term (current) drug therapy: Secondary | ICD-10-CM | POA: Insufficient documentation

## 2015-02-20 DIAGNOSIS — Z992 Dependence on renal dialysis: Secondary | ICD-10-CM | POA: Diagnosis not present

## 2015-02-20 DIAGNOSIS — I480 Paroxysmal atrial fibrillation: Secondary | ICD-10-CM | POA: Insufficient documentation

## 2015-02-20 DIAGNOSIS — I70299 Other atherosclerosis of native arteries of extremities, unspecified extremity: Secondary | ICD-10-CM | POA: Diagnosis present

## 2015-02-20 DIAGNOSIS — N189 Chronic kidney disease, unspecified: Secondary | ICD-10-CM | POA: Diagnosis not present

## 2015-02-20 DIAGNOSIS — D649 Anemia, unspecified: Secondary | ICD-10-CM | POA: Insufficient documentation

## 2015-02-20 DIAGNOSIS — I129 Hypertensive chronic kidney disease with stage 1 through stage 4 chronic kidney disease, or unspecified chronic kidney disease: Secondary | ICD-10-CM | POA: Insufficient documentation

## 2015-02-20 DIAGNOSIS — Z955 Presence of coronary angioplasty implant and graft: Secondary | ICD-10-CM | POA: Diagnosis not present

## 2015-02-20 DIAGNOSIS — I70244 Atherosclerosis of native arteries of left leg with ulceration of heel and midfoot: Secondary | ICD-10-CM | POA: Diagnosis not present

## 2015-02-20 DIAGNOSIS — R931 Abnormal findings on diagnostic imaging of heart and coronary circulation: Secondary | ICD-10-CM

## 2015-02-20 DIAGNOSIS — Z7901 Long term (current) use of anticoagulants: Secondary | ICD-10-CM | POA: Insufficient documentation

## 2015-02-20 DIAGNOSIS — I701 Atherosclerosis of renal artery: Secondary | ICD-10-CM | POA: Insufficient documentation

## 2015-02-20 DIAGNOSIS — L97429 Non-pressure chronic ulcer of left heel and midfoot with unspecified severity: Secondary | ICD-10-CM | POA: Insufficient documentation

## 2015-02-20 DIAGNOSIS — E039 Hypothyroidism, unspecified: Secondary | ICD-10-CM | POA: Diagnosis not present

## 2015-02-20 DIAGNOSIS — I70201 Unspecified atherosclerosis of native arteries of extremities, right leg: Secondary | ICD-10-CM | POA: Insufficient documentation

## 2015-02-20 DIAGNOSIS — T148XXA Other injury of unspecified body region, initial encounter: Secondary | ICD-10-CM

## 2015-02-20 DIAGNOSIS — I748 Embolism and thrombosis of other arteries: Secondary | ICD-10-CM | POA: Diagnosis not present

## 2015-02-20 DIAGNOSIS — Z87891 Personal history of nicotine dependence: Secondary | ICD-10-CM | POA: Diagnosis not present

## 2015-02-20 HISTORY — PX: PERIPHERAL VASCULAR CATHETERIZATION: SHX172C

## 2015-02-20 LAB — POCT I-STAT, CHEM 8
BUN: 31 mg/dL — AB (ref 6–20)
CALCIUM ION: 0.99 mmol/L — AB (ref 1.13–1.30)
Chloride: 98 mmol/L — ABNORMAL LOW (ref 101–111)
Creatinine, Ser: 5.8 mg/dL — ABNORMAL HIGH (ref 0.61–1.24)
Glucose, Bld: 184 mg/dL — ABNORMAL HIGH (ref 70–99)
HCT: 40 % (ref 39.0–52.0)
Hemoglobin: 13.6 g/dL (ref 13.0–17.0)
Potassium: 4.1 mmol/L (ref 3.5–5.1)
Sodium: 138 mmol/L (ref 135–145)
TCO2: 23 mmol/L (ref 0–100)

## 2015-02-20 LAB — PROTIME-INR
INR: 1.5 — AB (ref 0.00–1.49)
Prothrombin Time: 18.3 seconds — ABNORMAL HIGH (ref 11.6–15.2)

## 2015-02-20 LAB — GLUCOSE, CAPILLARY
GLUCOSE-CAPILLARY: 159 mg/dL — AB (ref 70–99)
Glucose-Capillary: 123 mg/dL — ABNORMAL HIGH (ref 70–99)

## 2015-02-20 SURGERY — ABDOMINAL AORTAGRAM

## 2015-02-20 SURGERY — ABDOMINAL AORTOGRAM

## 2015-02-20 MED ORDER — MIDAZOLAM HCL 2 MG/2ML IJ SOLN
INTRAMUSCULAR | Status: AC
  Filled 2015-02-20: qty 2

## 2015-02-20 MED ORDER — FENTANYL CITRATE (PF) 100 MCG/2ML IJ SOLN
INTRAMUSCULAR | Status: AC
  Filled 2015-02-20: qty 2

## 2015-02-20 MED ORDER — OXYCODONE-ACETAMINOPHEN 5-325 MG PO TABS: 1.0000 | ORAL_TABLET | Freq: Four times a day (QID) | ORAL | Status: DC | PRN

## 2015-02-20 MED ORDER — SODIUM CHLORIDE 0.9 % IJ SOLN: 3.0000 mL | Freq: Two times a day (BID) | INTRAMUSCULAR | Status: DC

## 2015-02-20 MED ORDER — ACETAMINOPHEN 325 MG PO TABS: 650.0000 mg | ORAL_TABLET | ORAL | Status: DC | PRN

## 2015-02-20 MED ORDER — HEPARIN (PORCINE) IN NACL 2-0.9 UNIT/ML-% IJ SOLN
INTRAMUSCULAR | Status: AC
  Filled 2015-02-20: qty 1000

## 2015-02-20 MED ORDER — SODIUM CHLORIDE 0.9 % IJ SOLN: 3.0000 mL | INTRAMUSCULAR | Status: DC | PRN

## 2015-02-20 MED ORDER — SODIUM CHLORIDE 0.9 % IV SOLN: 250.0000 mL | INTRAVENOUS | Status: DC | PRN

## 2015-02-20 MED ORDER — LIDOCAINE HCL (PF) 1 % IJ SOLN
INTRAMUSCULAR | Status: AC
  Filled 2015-02-20: qty 30

## 2015-02-20 SURGICAL SUPPLY — 9 items
CATH OMNI FLUSH 5F 65CM (CATHETERS) ×3 IMPLANT
CATH STRAIGHT 5FR 65CM (CATHETERS) ×3 IMPLANT
COVER PROBE U/S 5X48 (MISCELLANEOUS) ×3 IMPLANT
KIT PV (KITS) ×3 IMPLANT
SHEATH PINNACLE 5F 10CM (SHEATH) ×3 IMPLANT
TRANSDUCER W/STOPCOCK (MISCELLANEOUS) ×3 IMPLANT
TRAY PV CATH (CUSTOM PROCEDURE TRAY) ×3 IMPLANT
TUBING CIL FLEX 10 FLL-RA (TUBING) ×3 IMPLANT
WIRE BENTSON .035X145CM (WIRE) ×3 IMPLANT

## 2015-02-20 NOTE — Progress Notes (Signed)
Site area: rt groin Site Prior to Removal:  Level  0 Pressure Applied For:  20 minutes Manual:   Yes   Patient Status During Pull:   stable Post Pull Site:  Level   0 Post Pull Instructions Given:  yes Post Pull Pulses Present: yes-dopplered Dressing Applied:  tegaderm  Bedrest begins @  1535 Comments:  none

## 2015-02-20 NOTE — H&P (View-Only) (Signed)
  Referred by:  David B. Tapper, MD [address] [place], [place] [address]  Reason for referral: left heel ulcer  History of Present Illness  Matthew Espinoza is a 73 y.o. (05/31/1941) male who presents with chief complaint: left heel ulcer.  Onset of symptom occurred ~1.5 month, onset of left heel ulcer.  Exact mech of injury unknown.  Patient has been soaking the foot 20 minutes a day in vinegar and epsom salt.  Patient notes minimal pain.  He does not get rest pain and does not have intermittent claudication as his ambulation is limited by dyspnea upon exertion.  Atherosclerotic risk factors include: HTN, DM, prior smoking.  Past Medical History  Diagnosis Date  . CAD (coronary artery disease)     a. abnormal perfusion imaging study with multipe, large fixed  defects, EF39% b. EF 50-55% with apical akinesis by 2D echo. Status post coronary intervention with drug-eluting stent placement x3 October 2010 EF 40-45%   . Ventricular ectopy     History of nonsustained ventricular tachycardia., Status post EP study negative for inducible ventricular tachycardia  . Anemia     chronic  . Renal failure     chronic, on hemodialysis  . Hypothyroidism   . Diabetes mellitus   . HTN (hypertension)   . Hemodialysis adequacy testing     through a Vas-cath catheer  . Paroxysmal atrial fibrillation   . Syncope     s/p ILR no recurrent syncope and negative for arrhythmias last interrogation December 2012    Past Surgical History  Procedure Laterality Date  . Cataract extraction    . Cholecystectomy    . Implantable loop recorder    . Cardiac stents      History   Social History  . Marital Status: Divorced    Spouse Name: N/A  . Number of Children: N/A  . Years of Education: N/A   Occupational History  . Not on file.   Social History Main Topics  . Smoking status: Former Smoker -- 2.00 packs/day for 20 years    Types: Cigars    Start date: 10/21/1966    Quit date: 10/21/1986  .  Smokeless tobacco: Never Used     Comment: Smoked cigars but quit smoking in 1987 after smoking for 20 yrs.   . Alcohol Use: No  . Drug Use: No  . Sexual Activity: Not on file   Other Topics Concern  . Not on file   Social History Narrative   Widowed. Retired (worked operating a hardware store)     Family History  Problem Relation Age of Onset  . Diabetes Other   . Hypertension Other   . Diabetes Mother   . Diabetes Father   . Diabetes Sister   . Hypertension Sister   . Cancer Brother   . Varicose Veins Daughter     Current Outpatient Prescriptions on File Prior to Visit  Medication Sig Dispense Refill  . atorvastatin (LIPITOR) 80 MG tablet Take 1 tablet (80 mg total) by mouth every evening. 90 tablet 3  . Calcium Acetate 667 MG TABS Take 2 tablets by mouth 2 (two) times daily with a meal. & 1 with snacks    . folic acid-vitamin b complex-vitamin c-selenium-zinc (DIALYVITE) 3 MG TABS Take 1 tablet by mouth daily.      . levothyroxine (LEVOTHROID) 175 MCG tablet Take 175 mcg by mouth daily.      . metoprolol succinate (TOPROL-XL) 50 MG 24 hr tablet   TAKE 1 BY MOUTH DAILY      TAKE WITH OR IMMEDIATELY   FOLLOWING A MEAL. 90 tablet 3  . NOVOLIN N 100 UNIT/ML injection Inject 40 Units into the skin as directed.     . omeprazole (PRILOSEC) 20 MG capsule Take 20 mg by mouth daily.     . polyethylene glycol powder (MIRALAX) powder Take 17 g by mouth as needed.     . SENSIPAR 30 MG tablet Take 1 tablet by mouth daily.    . sevelamer carbonate (RENVELA) 2.4 G PACK 2.4 g 3 (three) times daily with meals.    . warfarin (COUMADIN) 2 MG tablet Take 1 tablet daily or as directed 45 tablet 3  . permethrin (ELIMITE) 5 % cream Apply 1 application topically as directed.  0  . triamcinolone cream (KENALOG) 0.1 % Apply 1 application topically as needed.  3   No current facility-administered medications on file prior to visit.    No Known Allergies    REVIEW OF SYSTEMS:  (Positives checked  otherwise negative)  CARDIOVASCULAR:  [] chest pain, [] chest pressure, [] palpitations, [] shortness of breath when laying flat, [x] shortness of breath with exertion,  [x] pain in feet when walking, [x] pain in feet when laying flat, [] history of blood clot in veins (DVT), [] history of phlebitis, [x] swelling in legs, [] varicose veins  PULMONARY:  [] productive cough, [] asthma, [] wheezing  NEUROLOGIC:  [x] weakness in arms or legs, [x] numbness in arms or legs, [] difficulty speaking or slurred speech, [] temporary loss of vision in one eye, [x] dizziness  HEMATOLOGIC:  [] bleeding problems, [] problems with blood clotting too easily  MUSCULOSKEL:  [] joint pain, [] joint swelling  GASTROINTEST:  [] vomiting blood, [] blood in stool     GENITOURINARY:  [] burning with urination, [] blood in urine  PSYCHIATRIC:  [] history of major depression  INTEGUMENTARY:  [] rashes, [x] ulcers  CONSTITUTIONAL:  [] fever, [] chills  For VQI Use Only  PRE-ADM LIVING: Home  AMB STATUS: Ambulatory  CAD Sx: None  PRIOR CHF: None  STRESS TEST: [x] No, [ ] Normal, [ ] + ischemia, [ ] + MI, [ ] Both   Physical Examination  Filed Vitals:   02/13/15 1025  BP: 116/70  Pulse: 95  Resp: 18  Height: 5' 11" (1.803 m)  Weight: 209 lb (94.802 kg)   Body mass index is 29.16 kg/(m^2).  General: A&O x 3, WDWN  Head: [place]/AT  Ear/Nose/Throat: Hearing grossly intact, nares w/o erythema or drainage, oropharynx w/o Erythema/Exudate, Mallampati score: 3  Eyes: PERRLA, EOMI  Neck: Supple, no nuchal rigidity, no palpable LAD  Pulmonary: Sym exp, good air movt, CTAB, no rales, rhonchi, & wheezing  Cardiac: RRR, Nl S1, S2, no Murmurs, rubs or gallops Vascular: Vessel Right Left  Radial Palpable Palpable  Ulnar Not Palpable Not Palpable  Brachial Palpable Palpable  Carotid Palpable, without bruit Palpable, without bruit  Aorta Not palpable N/A  Femoral Palpable Palpable  Popliteal Not  palpable Not palpable  PT Faintly Palpable Faintly Palpable  DP Not Palpable Not Palpable   Gastrointestinal: soft, NTND, -G/R, - HSM, - masses, - CVAT B  Musculoskeletal: M/S 5/5 throughout , Extremities without ischemic changes , pale ulcer on lateral heel without any drainage or erythema  Neurologic: CN 2-12 intact , Pain and light touch intact in extremities except B feet decreased (L>R), Motor exam as listed above    Psychiatric: Judgment intact, Mood & affect appropriate for pt's clinical situation  Dermatologic: See M/S exam for extremity exam, no rashes otherwise noted  Lymph : No Cervical, Axillary, or Inguinal lymphadenopathy    Non-Invasive Vascular Imaging  Outside ABI (Date: 01/20/15)  R: [place], Fem and pop Tri, tibial mono  L: 0.39. Fem tri, pop and tibial mono  Outside Studies/Documentation 5 pages of outside documents were reviewed including: outpatient PCP chart and outside ABI .  Medical Decision Making  Matthew Espinoza is a 73 y.o. 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   I discussed with the patient the natural history of critical limb ischemia: 25% require amputation in one year, 50% are able to maintain their limbs in one year, and 25-30% die in one year due to comorbidities.  Given the limb threatening status of this patient, I recommend an aggressive work up including proceeding with an: Aortogram, Bilateral runoff and ? LLE intervention. I discussed with the patient the nature of angiographic procedures, especially the limited patencies of any endovascular intervention. The patient is aware of that the risks of an angiographic procedure include but are not limited to: bleeding, infection, access site complications, embolization, rupture of treated vessel, dissection, possible need for emergent surgical intervention, and possible need for surgical procedures to treat the patient's pathology. The patient is aware of the  risks and agrees to proceed.  The procedure is scheduled for: 2 MAY 16.  I discussed in depth with the patient the nature of atherosclerosis, and emphasized the importance of maximal medical management including strict control of blood pressure, blood glucose, and lipid levels, antiplatelet agents, obtaining regular exercise, and cessation of smoking.  The patient is aware that without maximal medical management the underlying atherosclerotic disease process will progress, limiting the benefit of any interventions. The patient is currently on a statin:  Lipitor. The patient is currently not on an anti-platelet due to bleeding risk as pt is also on Warfarin.  Thank you for allowing us to participate in this patient's care.  Edmon Magid, MD [facility] Office: [phone] Pager: [phone]  02/13/2015, 11:07 AM     

## 2015-02-20 NOTE — Discharge Instructions (Signed)

## 2015-02-20 NOTE — Interval H&P Note (Signed)
[facility]  History and Physical Update  The patient was interviewed and re-examined.  The patient's previous History and Physical has been reviewed and is unchanged from my consult .  There is no change in the plan of care: Aortogram, bilateral leg runoff, and possible left leg runoff.  Leonides SakeBrian Thanh Pomerleau, MD [facility] [place] Office: [phone] Pager: [phone]  02/20/2015, 10:59 AM

## 2015-02-20 NOTE — Op Note (Signed)
OPERATIVE NOTE   PROCEDURE: 1.  Right common femoral artery cannulation under ultrasound guidance 2.  Placement of catheter in aorta 3.  Aortogram 4.  Third order arterial selection 5.  Left leg runoff via catheter 6.  Right leg runoff via sheath   PRE-OPERATIVE DIAGNOSIS: Non-healing left heel ulcer  POST-OPERATIVE DIAGNOSIS: same as above   SURGEON: Leonides Sake, MD  ANESTHESIA: conscious sedation  ESTIMATED BLOOD LOSS: 50 cc  CONTRAST: 160 cc  FINDING(S):  Aorta: patent, calcified  Superior mesenteric artery: patent Celiac artery: not visualized Inferior mesenteric artery: patent   Right Left  RA Patent, 70-90% stenosis, no nephrogram Patent, >90% stenosis, no nephrogram  CIA Patent, calcified Patent, calcified  EIA Patent, calcified Patent, calcified  IIA Patent Patent  CFA Patent, calcified Patent, calcified  SFA Patent, calcified Patent, calcified  PFA Patent Patent  Pop Patent Patent  Trif Not well visualized Patent  AT Occluded Patent proximally, occluded distally  Pero Not well visualized Patent, miniscule distally  PT Not well visualized, faint image appears patent Patent proximally, occluded distally   SPECIMEN(S):  none  INDICATIONS:   Matthew Espinoza is a 74 y.o. male who presents with left non-healing heel ulcer.  The patient presents for: aortogram, bilateral leg runoff, and possible left leg intervention.  I discussed with the patient the nature of angiographic procedures, especially the limited patencies of any endovascular intervention.  The patient is aware of that the risks of an angiographic procedure include but are not limited to: bleeding, infection, access site complications, renal failure, embolization, rupture of vessel, dissection, possible need for emergent surgical intervention, possible need for surgical procedures to treat the patient's pathology, and stroke and death.  The patient is aware of the risks and agrees to  proceed.  DESCRIPTION: After full informed consent was obtained from the patient, the patient was brought back to the angiography suite.  The patient was placed supine upon the angiography table and connected to monitoring equipment.  The patient was then given conscious sedation, the amounts of which are documented in the patient's chart.  The patient was prepped and drape in the standard fashion for an angiographic procedure.  At this point, attention was turned to the right groin.  Under ultrasound guidance,  The subcutaneous tissue surrounding the right common femoral artery was anesthesized with 1% lidocaine with epinephrine.  The artery was then cannulated with a 18 gauge needle.  The [facility] wire was passed up into the aorta.  The needle was exchanged for a 5-Fr sheath, which was advanced over the wire into the common femoral artery.  The dilator was then removed.  The Omniflush catheter was then loaded over the wire up to the level of L1.  The catheter was connected to the power injector circuit.  After de-airring and de-clotting the circuit, a power injector aortogram was completed.  The [facility] wire was replaced in the catheter, and using the [place] and Omniflush catheter, the left common iliac artery was selected.  The catheter would not cross the bifurcation so I exchanged it for a straight catheter which was lodged in the left external iliac artery.  An automated left leg runoff was completed.  From the beginning of this study, extremely slow blood flow in the left leg was seen.  The blood transient time was so slow, the fluoroscopy machine timed out before the study was completed.  I replaced the [facility] wire in the catheter and advanced the wire and catheter into the  left superficial femoral artery.  The catheter was connected to the power injector.  Stationary injections at the tibia and distal ankle were completed to fully image the left leg.  Based these images, I did not feel any endovascular or  operative intervention is likely to be helpful.    I then removed the catheter.  The sheath was aspirated.  No clots were present and the sheath was reloaded with heparinized saline.  The right femoral sheath was connected to the power injector.  An automated right leg runoff was completed.  This again demonstrated very blood flow in this leg.  Stational injections at the tibia and distal ankle were necessary to finish the imaging of the right leg.  Unfortunately, the contrast washed out in this patient's leg, causing the distal right leg images to be limited.  The sheath was aspirated.  No clots were present and the sheath was reloaded with heparinized saline.    This patient will need: cardiac optimization, wound care management of left foot as no surgical or endovascular options.  COMPLICATIONS: none  CONDITION: stable    Leonides SakeBrian Tia Hieronymus, MD [facility] [place] Office: [phone] Pager: [phone]  02/20/2015, 3:34 PM

## 2015-02-21 ENCOUNTER — Telehealth: Payer: Self-pay | Admitting: Vascular Surgery

## 2015-02-21 NOTE — Telephone Encounter (Signed)
02/21/15 @ 2:16pm: [facility].   As for cardiology, it looks as though it has already been [facility] from [facility]   I will verify both appts once [facility] hear [facility]

## 2015-02-21 NOTE — Telephone Encounter (Signed)
-----   Message from Phillips Odorarol S Pullins, RN sent at 02/20/2015  4:50 PM EDT ----- Regarding: Matthew Espinoza log;  also needs 4 wk. f/u with [facility] and Referral to Wound Center and Cardiology Wound Center Referral: nonhealing Left Heel Ulcer Cardiology Referral: recent Aortogram raised concern for compromised EF  ----- Message -----    From: Fransisco HertzBrian L Chen, MD    Sent: 02/20/2015   3:46 PM      To: [facility] [address]  Salli QuarryJames G Thomley [id number] 1941/10/13  PROCEDURE: 1.  Right common femoral artery cannulation under ultrasound guidance 2.  Placement of catheter in aorta 3.  Aortogram 4.  Third order arterial selection 5.  Left leg runoff via catheter 6.  Right leg runoff via sheath   Follow-up: 4 weeks  Needs: 1.  Referral to Wound center  2.  Referral to Cardiology for abnormal blood transient time in both leg concerning for compromised EF

## 2015-02-23 ENCOUNTER — Encounter (HOSPITAL_COMMUNITY): Payer: Self-pay | Admitting: Vascular Surgery

## 2015-02-24 NOTE — Telephone Encounter (Signed)
Patient called back requesting that we schedule [facility] at [facility]. I have faxed all the information/referral- dpm

## 2015-02-28 ENCOUNTER — Ambulatory Visit (INDEPENDENT_AMBULATORY_CARE_PROVIDER_SITE_OTHER): Payer: Medicare Other | Admitting: *Deleted

## 2015-02-28 DIAGNOSIS — Z7901 Long term (current) use of anticoagulants: Secondary | ICD-10-CM

## 2015-02-28 DIAGNOSIS — Z5181 Encounter for therapeutic drug level monitoring: Secondary | ICD-10-CM | POA: Diagnosis not present

## 2015-02-28 DIAGNOSIS — I4891 Unspecified atrial fibrillation: Secondary | ICD-10-CM

## 2015-02-28 LAB — POCT INR: INR: 2.2

## 2015-02-28 MED FILL — Heparin Sodium (Porcine) 2 Unit/ML in Sodium Chloride 0.9%: INTRAMUSCULAR | Qty: 1000 | Status: AC

## 2015-02-28 MED FILL — Lidocaine HCl Local Preservative Free (PF) Inj 1%: INTRAMUSCULAR | Qty: 30 | Status: AC

## 2015-02-28 NOTE — Telephone Encounter (Signed)
I left a message for Mr Tasia CatchingsCraig to please call me. I would like to know if [facility] has contacted him to schedule an appointment. I have also [facility] a message with [facility], the referral was sent in on 02/27/15. dpm

## 2015-03-06 ENCOUNTER — Ambulatory Visit: Payer: Medicare Other | Admitting: Cardiovascular Disease

## 2015-03-22 ENCOUNTER — Encounter (HOSPITAL_BASED_OUTPATIENT_CLINIC_OR_DEPARTMENT_OTHER): Payer: Medicare Other | Attending: Surgery

## 2015-03-28 ENCOUNTER — Ambulatory Visit (INDEPENDENT_AMBULATORY_CARE_PROVIDER_SITE_OTHER): Payer: Medicare Other | Admitting: *Deleted

## 2015-03-28 DIAGNOSIS — Z5181 Encounter for therapeutic drug level monitoring: Secondary | ICD-10-CM | POA: Diagnosis not present

## 2015-03-28 DIAGNOSIS — I4891 Unspecified atrial fibrillation: Secondary | ICD-10-CM | POA: Diagnosis not present

## 2015-03-28 DIAGNOSIS — Z7901 Long term (current) use of anticoagulants: Secondary | ICD-10-CM | POA: Diagnosis not present

## 2015-03-28 LAB — POCT INR

## 2015-03-30 ENCOUNTER — Ambulatory Visit (INDEPENDENT_AMBULATORY_CARE_PROVIDER_SITE_OTHER): Payer: Medicare Other | Admitting: *Deleted

## 2015-03-30 DIAGNOSIS — Z5181 Encounter for therapeutic drug level monitoring: Secondary | ICD-10-CM | POA: Diagnosis not present

## 2015-03-30 DIAGNOSIS — Z7901 Long term (current) use of anticoagulants: Secondary | ICD-10-CM | POA: Diagnosis not present

## 2015-03-30 DIAGNOSIS — I4891 Unspecified atrial fibrillation: Secondary | ICD-10-CM | POA: Diagnosis not present

## 2015-03-30 LAB — POCT INR: INR: 5.4

## 2015-04-06 ENCOUNTER — Ambulatory Visit (INDEPENDENT_AMBULATORY_CARE_PROVIDER_SITE_OTHER): Payer: Medicare Other | Admitting: *Deleted

## 2015-04-06 DIAGNOSIS — Z5181 Encounter for therapeutic drug level monitoring: Secondary | ICD-10-CM | POA: Diagnosis not present

## 2015-04-06 DIAGNOSIS — I4891 Unspecified atrial fibrillation: Secondary | ICD-10-CM

## 2015-04-06 LAB — POCT INR: INR: 2

## 2015-04-07 ENCOUNTER — Encounter: Payer: Self-pay | Admitting: *Deleted

## 2015-04-12 ENCOUNTER — Encounter: Payer: Self-pay | Admitting: Cardiology

## 2015-04-12 ENCOUNTER — Encounter: Payer: Self-pay | Admitting: *Deleted

## 2015-04-12 ENCOUNTER — Ambulatory Visit (INDEPENDENT_AMBULATORY_CARE_PROVIDER_SITE_OTHER): Payer: Medicare Other | Admitting: Cardiology

## 2015-04-12 VITALS — BP 118/75 | HR 84 | Ht 71.0 in | Wt 202.0 lb

## 2015-04-12 DIAGNOSIS — I4891 Unspecified atrial fibrillation: Secondary | ICD-10-CM

## 2015-04-12 DIAGNOSIS — I2510 Atherosclerotic heart disease of native coronary artery without angina pectoris: Secondary | ICD-10-CM

## 2015-04-12 DIAGNOSIS — L97909 Non-pressure chronic ulcer of unspecified part of unspecified lower leg with unspecified severity: Secondary | ICD-10-CM

## 2015-04-12 DIAGNOSIS — Z7901 Long term (current) use of anticoagulants: Secondary | ICD-10-CM | POA: Diagnosis not present

## 2015-04-12 DIAGNOSIS — I70299 Other atherosclerosis of native arteries of extremities, unspecified extremity: Secondary | ICD-10-CM

## 2015-04-12 NOTE — Patient Instructions (Signed)
Your physician wants you to follow-up in: 6 months with Dr. Lurena Joiner will receive a reminder letter in the mail two months in advance. If you don't receive a letter, please call our office to schedule the follow-up appointment.  Your physician recommends that you continue on your current medications as directed. Please refer to the Current Medication list given to you today.  WE WILL REQUEST LABS FROM DR. TAPPER  Thank you for choosing [facility]!!

## 2015-04-12 NOTE — Progress Notes (Signed)
Clinical Summary Matthew Espinoza is a 74 y.o.male seen today for follow up of the following medical problems.   1. CAD/ICM  - prior stenting to RCA in 2010.  11/2014 echo LVEF 50-55%, suggestion of WMAs  - denies any chest pain. Breathing improving since last visit, he associates to increased thyroid treatment.  - compliant with meds. No ACE-I due to low bp's while on HD.   2. Afib  - followed by Dr Johney Frame in EP clinic  - denies any palpitaions. No bleeding issues on coumadin   3. Hyperlipidemia  Jan 2015 panel: TC 70 TG 75 HDL 30 LDL 25 - compliant with high dose statin   4. ESRD - on HD Tues, Thurs, Sat - notes some low bp's on HD at times  5. Foot ulcer - followed by vascular  Past Medical History  Diagnosis Date  . CAD (coronary artery disease)     a. abnormal perfusion imaging study with multipe, large fixed  defects, EF39% b. EF 50-55% with apical akinesis by 2D echo. Status post coronary intervention with drug-eluting stent placement x3 October 2010 EF 40-45%   . Ventricular ectopy     History of nonsustained ventricular tachycardia., Status post EP study negative for inducible ventricular tachycardia  . Anemia     chronic  . Renal failure     chronic, on hemodialysis  . Hypothyroidism   . Diabetes mellitus   . HTN (hypertension)   . Hemodialysis adequacy testing     through a Vas-cath catheer  . Paroxysmal atrial fibrillation   . Syncope     s/p ILR no recurrent syncope and negative for arrhythmias last interrogation December 2012     No Known Allergies   Current Outpatient Prescriptions  Medication Sig Dispense Refill  . atorvastatin (LIPITOR) 80 MG tablet Take 1 tablet (80 mg total) by mouth every evening. 90 tablet 3  . Calcium Acetate 667 MG TABS Take 2 tablets by mouth 2 (two) times daily with a meal.     . folic acid-vitamin b complex-vitamin c-selenium-zinc (DIALYVITE) 3 MG TABS Take 1 tablet by mouth daily.      Marland Kitchen levothyroxine (LEVOTHROID)  175 MCG tablet Take 87.5 mcg by mouth daily.     . metoprolol succinate (TOPROL-XL) 50 MG 24 hr tablet TAKE 1 BY MOUTH DAILY      TAKE WITH OR IMMEDIATELY   FOLLOWING A MEAL. 90 tablet 3  . NOVOLIN N 100 UNIT/ML injection Inject 35 Units into the skin daily.     Marland Kitchen omeprazole (PRILOSEC) 20 MG capsule Take 20 mg by mouth daily.     . permethrin (ELIMITE) 5 % cream Apply 1 application topically as directed.  0  . polyethylene glycol powder (MIRALAX) powder Take 17 g by mouth as needed for mild constipation.     Marland Kitchen RENVELA 800 MG tablet Take 3,200 mg by mouth 2 (two) times daily.     . SENSIPAR 30 MG tablet Take 30 mg by mouth daily.     Matthew Espinoza TEST test strip 1 each by Other route as needed (blood sugar).     Marland Kitchen ULTICARE INSULIN SYRINGE 31G X 5/16" 0.5 ML MISC     . warfarin (COUMADIN) 2 MG tablet Take 1 tablet daily or as directed (Patient taking differently: Take 1-2 mg by mouth daily. Alternating between  and  daily) 45 tablet 3   No current facility-administered medications for this visit.     Past Surgical History  Procedure Laterality Date  . Cataract extraction    . Cholecystectomy    . Implantable loop recorder    . Cardiac stents    . Peripheral vascular catheterization N/A 02/20/2015    Procedure: Abdominal Aortogram;  Surgeon: Fransisco Hertz, MD;  Location: [facility]UP[facility];  Service: Cardiovascular;  Laterality: N/A;     No Known Allergies    Family History  Problem Relation Age of Onset  . Diabetes Other   . Hypertension Other   . Diabetes Mother   . Diabetes Father   . Diabetes Sister   . Hypertension Sister   . Cancer Brother   . Varicose Veins Daughter      Social History Matthew Espinoza reports that he quit smoking about 28 years ago. His smoking use included Cigars. He started smoking about 48 years ago. He has never used smokeless tobacco. Matthew Espinoza reports that he does not drink alcohol.   Review of Systems CONSTITUTIONAL: No weight loss,  fever, chills, weakness or fatigue.  HEENT: Eyes: No visual loss, blurred vision, double vision or yellow sclerae.No hearing loss, sneezing, congestion, runny nose or sore throat.  SKIN: No rash or itching.  CARDIOVASCULAR: per HPI RESPIRATORY: No shortness of breath, cough or sputum.  GASTROINTESTINAL: No anorexia, nausea, vomiting or diarrhea. No abdominal pain or blood.  GENITOURINARY: No burning on urination, no polyuria NEUROLOGICAL: No headache, dizziness, syncope, paralysis, ataxia, numbness or tingling in the extremities. No change in bowel or bladder control.  MUSCULOSKELETAL: No muscle, back pain, joint pain or stiffness.  LYMPHATICS: No enlarged nodes. No history of splenectomy.  PSYCHIATRIC: No history of depression or anxiety.  ENDOCRINOLOGIC: No reports of sweating, cold or heat intolerance. No polyuria or polydipsia.  Marland Kitchen   Physical Examination Filed Vitals:   04/12/15 0916  BP: 118/75  Pulse: 84   Filed Vitals:   04/12/15 0916  Height:  (1.803 m)  Weight: 202 lb (91.627 kg)    Gen: resting comfortably, no acute distress HEENT: no scleral icterus, pupils equal round and reactive, no palptable cervical adenopathy,  CV: irreg, no m/r/g, no JVD Resp: Clear to auscultation bilaterally GI: abdomen is soft, non-tender, non-distended, normal bowel sounds, no hepatosplenomegaly MSK: extremities are warm, no edema.  Skin: warm, no rash Neuro:  no focal deficits Psych: appropriate affect   Diagnostic Studies 07/2009 Echo: :LVEF 35-40%, mild LVH, apical akinesis, grade I diastolic dyfuncttion, mod LAE  07/2009 Cath:  LAD occlusion, severe RCA disease  11/2014 echo Study Conclusions  - Procedure narrative: Transthoracic echocardiography for left ventricular function evaluation, for right ventricular function evaluation, and for assessment of valvular function. Image quality was fair. There was suboptimal endocardial visualization. - Left ventricle:  There was severe concentric hypertrophy. Systolic function was normal. The estimated ejection fraction was in the range of 50% to 55%. The apex and anteroapical wall appeared akinetic. The study was not technically sufficient to allow evaluation of LV diastolic dysfunction due to atrial fibrillation/flutter. Doppler parameters are consistent with high ventricular filling pressure. - Regional wall motion abnormality: Possible akinesis of the mid anteroseptal myocardium; possible severe hypokinesis of the mid anterior myocardium. Lateral wall not well visualized. - Aortic valve: Mildly calcified annulus. Trileaflet; mildly thickened leaflets. - Mitral valve: Moderate posterior mitral annular calcification. There was mild regurgitation. - Left atrium: The atrium was severely dilated. Volume/bsa, ES, (1-plane Simpson&'s, A2C): 54.2 ml/m^2. - Right ventricle: Systolic function was mildly reduced. - Right atrium: The atrium was mildly to  moderately dilated. - Tricuspid valve: There was mild regurgitation. Pulmonary pressures mildly elevated: 41 mmHg. - Systemic veins: IVC is dilated with normal respiratory variation: estimated CVP 8 mmHg. - Pericardium, extracardiac: A trivial pericardial effusion was identified.   Assessment and Plan   1. CAD  - medical therapy has been somewhat limited due to low bp's during dialysis, have not started an ACE - continue current meds  2. Afib/Aflutter -no current symptoms  - continue rate control with Toprol, continue regular EP follow up  - continue coumadin for stroke prophylaxis   3. Hyperlipidemia  - request most recent panel from pcp  F/u 6 months     Antoine Poche, M.D.

## 2015-04-19 ENCOUNTER — Other Ambulatory Visit: Payer: Self-pay | Admitting: Cardiology

## 2015-04-20 ENCOUNTER — Ambulatory Visit (INDEPENDENT_AMBULATORY_CARE_PROVIDER_SITE_OTHER): Payer: Medicare Other | Admitting: *Deleted

## 2015-04-20 DIAGNOSIS — Z5181 Encounter for therapeutic drug level monitoring: Secondary | ICD-10-CM

## 2015-04-20 DIAGNOSIS — I4891 Unspecified atrial fibrillation: Secondary | ICD-10-CM

## 2015-04-20 LAB — POCT INR: INR: 2.1

## 2015-05-04 ENCOUNTER — Ambulatory Visit (INDEPENDENT_AMBULATORY_CARE_PROVIDER_SITE_OTHER): Payer: Medicare Other | Admitting: *Deleted

## 2015-05-04 DIAGNOSIS — I4891 Unspecified atrial fibrillation: Secondary | ICD-10-CM | POA: Diagnosis not present

## 2015-05-04 DIAGNOSIS — Z5181 Encounter for therapeutic drug level monitoring: Secondary | ICD-10-CM | POA: Diagnosis not present

## 2015-05-04 LAB — POCT INR: INR: 1.5

## 2015-05-18 ENCOUNTER — Ambulatory Visit (INDEPENDENT_AMBULATORY_CARE_PROVIDER_SITE_OTHER): Payer: Medicare Other | Admitting: *Deleted

## 2015-05-18 DIAGNOSIS — Z5181 Encounter for therapeutic drug level monitoring: Secondary | ICD-10-CM

## 2015-05-18 DIAGNOSIS — I4891 Unspecified atrial fibrillation: Secondary | ICD-10-CM

## 2015-05-18 LAB — POCT INR: INR: 1.4

## 2015-05-25 ENCOUNTER — Ambulatory Visit (INDEPENDENT_AMBULATORY_CARE_PROVIDER_SITE_OTHER): Payer: Medicare Other | Admitting: *Deleted

## 2015-05-25 DIAGNOSIS — Z5181 Encounter for therapeutic drug level monitoring: Secondary | ICD-10-CM

## 2015-05-25 DIAGNOSIS — I4891 Unspecified atrial fibrillation: Secondary | ICD-10-CM

## 2015-05-25 LAB — POCT INR: INR: 1.9

## 2015-06-06 ENCOUNTER — Telehealth: Payer: Self-pay | Admitting: *Deleted

## 2015-06-06 ENCOUNTER — Ambulatory Visit (INDEPENDENT_AMBULATORY_CARE_PROVIDER_SITE_OTHER): Payer: Medicare Other | Admitting: *Deleted

## 2015-06-06 DIAGNOSIS — I4891 Unspecified atrial fibrillation: Secondary | ICD-10-CM | POA: Diagnosis not present

## 2015-06-06 DIAGNOSIS — Z5181 Encounter for therapeutic drug level monitoring: Secondary | ICD-10-CM

## 2015-06-06 LAB — POCT INR: INR: 2.3

## 2015-06-06 MED ORDER — METOPROLOL SUCCINATE ER 25 MG PO TB24: 12.5000 mg | ORAL_TABLET | Freq: Every day | ORAL | Status: DC

## 2015-06-06 NOTE — Telephone Encounter (Signed)
Staci will you call pt at home.  He will probably need a new Rx. Thanks, Misty Stanley

## 2015-06-06 NOTE — Telephone Encounter (Signed)
Pt here for coumadin check.  States his BP is dropping too much after taking metoprolol.  Has cut back to 1/2 tablet daily which helps but he still feels bad.  Wants to know if you can stop metoprolol and put him on something for heart rate control that won't drop his BP.  On dialysis 3 x week.

## 2015-06-06 NOTE — Telephone Encounter (Signed)
New rx sent in pt aware.

## 2015-06-06 NOTE — Telephone Encounter (Signed)
Toprol is the best option for him given his history of heart blockages. We can decrease him to Toprol XL 12.5mg  daily and see how that does.   Dominga Ferry MD

## 2015-06-21 ENCOUNTER — Other Ambulatory Visit: Payer: Self-pay | Admitting: *Deleted

## 2015-06-21 MED ORDER — METOPROLOL SUCCINATE ER 25 MG PO TB24: 12.5000 mg | ORAL_TABLET | Freq: Every day | ORAL | Status: DC

## 2015-06-27 ENCOUNTER — Ambulatory Visit (INDEPENDENT_AMBULATORY_CARE_PROVIDER_SITE_OTHER): Payer: Medicare Other | Admitting: *Deleted

## 2015-06-27 DIAGNOSIS — I4891 Unspecified atrial fibrillation: Secondary | ICD-10-CM | POA: Diagnosis not present

## 2015-06-27 DIAGNOSIS — Z5181 Encounter for therapeutic drug level monitoring: Secondary | ICD-10-CM

## 2015-06-27 LAB — POCT INR: INR: 3.2

## 2015-07-11 ENCOUNTER — Ambulatory Visit (INDEPENDENT_AMBULATORY_CARE_PROVIDER_SITE_OTHER): Payer: Medicare Other | Admitting: *Deleted

## 2015-07-11 DIAGNOSIS — Z5181 Encounter for therapeutic drug level monitoring: Secondary | ICD-10-CM

## 2015-07-11 DIAGNOSIS — I4891 Unspecified atrial fibrillation: Secondary | ICD-10-CM | POA: Diagnosis not present

## 2015-07-11 LAB — POCT INR: INR: 2.2

## 2015-07-26 ENCOUNTER — Other Ambulatory Visit: Payer: Self-pay | Admitting: *Deleted

## 2015-07-26 MED ORDER — WARFARIN SODIUM 2 MG PO TABS: 1.0000 mg | ORAL_TABLET | Freq: Every day | ORAL | Status: DC

## 2015-08-08 ENCOUNTER — Ambulatory Visit (INDEPENDENT_AMBULATORY_CARE_PROVIDER_SITE_OTHER): Payer: Medicare Other | Admitting: *Deleted

## 2015-08-08 DIAGNOSIS — I4891 Unspecified atrial fibrillation: Secondary | ICD-10-CM | POA: Diagnosis not present

## 2015-08-08 DIAGNOSIS — Z5181 Encounter for therapeutic drug level monitoring: Secondary | ICD-10-CM | POA: Diagnosis not present

## 2015-08-08 LAB — POCT INR: INR: 1.9

## 2015-08-29 ENCOUNTER — Ambulatory Visit (INDEPENDENT_AMBULATORY_CARE_PROVIDER_SITE_OTHER): Payer: Medicare Other | Admitting: *Deleted

## 2015-08-29 DIAGNOSIS — I4891 Unspecified atrial fibrillation: Secondary | ICD-10-CM | POA: Diagnosis not present

## 2015-08-29 DIAGNOSIS — Z5181 Encounter for therapeutic drug level monitoring: Secondary | ICD-10-CM | POA: Diagnosis not present

## 2015-08-29 LAB — POCT INR: INR: 3.9

## 2015-09-19 ENCOUNTER — Ambulatory Visit (INDEPENDENT_AMBULATORY_CARE_PROVIDER_SITE_OTHER): Payer: Medicare Other | Admitting: *Deleted

## 2015-09-19 DIAGNOSIS — Z5181 Encounter for therapeutic drug level monitoring: Secondary | ICD-10-CM | POA: Diagnosis not present

## 2015-09-19 DIAGNOSIS — I4891 Unspecified atrial fibrillation: Secondary | ICD-10-CM | POA: Diagnosis not present

## 2015-09-19 LAB — POCT INR: INR: 1.8

## 2015-09-25 ENCOUNTER — Encounter: Payer: Self-pay | Admitting: Cardiology

## 2015-09-25 ENCOUNTER — Ambulatory Visit (INDEPENDENT_AMBULATORY_CARE_PROVIDER_SITE_OTHER): Payer: Medicare Other | Admitting: Cardiology

## 2015-09-25 ENCOUNTER — Encounter: Payer: Self-pay | Admitting: *Deleted

## 2015-09-25 VITALS — BP 104/67 | HR 89 | Ht 71.0 in | Wt 195.0 lb

## 2015-09-25 DIAGNOSIS — L97909 Non-pressure chronic ulcer of unspecified part of unspecified lower leg with unspecified severity: Secondary | ICD-10-CM | POA: Diagnosis not present

## 2015-09-25 DIAGNOSIS — I4891 Unspecified atrial fibrillation: Secondary | ICD-10-CM

## 2015-09-25 DIAGNOSIS — I2510 Atherosclerotic heart disease of native coronary artery without angina pectoris: Secondary | ICD-10-CM | POA: Diagnosis not present

## 2015-09-25 DIAGNOSIS — I70299 Other atherosclerosis of native arteries of extremities, unspecified extremity: Secondary | ICD-10-CM

## 2015-09-25 DIAGNOSIS — Z7901 Long term (current) use of anticoagulants: Secondary | ICD-10-CM | POA: Diagnosis not present

## 2015-09-25 NOTE — Patient Instructions (Signed)
Your physician wants you to follow-up in: 6 months with  You will receive a reminder letter in the mail two months in advance. If you don't receive a letter, please call our office to schedule the follow-up appointment.  Your physician recommends that you continue on your current medications as directed. Please refer to the Current Medication list given to you today.  WE WILL REQUEST LABS  Thank you for choosing [facility]!!

## 2015-09-25 NOTE — Progress Notes (Signed)
Patient ID: Matthew Espinoza, male   DOB: 1941/08/04, 74 y.o.   MRN: [id number]     Clinical Summary Mr. Hehn is a 74 y.o.male seen today for follow up of the following medical problems.    1. CAD/ICM  - prior stenting to RCA in 2010.  11/2014 echo LVEF 50-55% - compliant with meds. No ACE-I due to low bp's while on HD. We recently had to cut back on his Toprol due to low bp's, He currently is only taking on evening after HD.    2. Afib  - followed by Dr Johney Frame in EP clinic  - no recent palpitations.  No bleeding issues on coumadin  3. Hyperlipidemia  - has labs with pcp this month - compliant with high dose statin   4. ESRD - on HD Tues, Thurs, Sat - notes some low bp's on HD at times  Past Medical History  Diagnosis Date  . CAD (coronary artery disease)     a. abnormal perfusion imaging study with multipe, large fixed  defects, EF39% b. EF 50-55% with apical akinesis by 2D echo. Status post coronary intervention with drug-eluting stent placement x3 October 2010 EF 40-45%   . Ventricular ectopy     History of nonsustained ventricular tachycardia., Status post EP study negative for inducible ventricular tachycardia  . Anemia     chronic  . Renal failure     chronic, on hemodialysis  . Hypothyroidism   . Diabetes mellitus   . HTN (hypertension)   . Hemodialysis adequacy testing     through a Vas-cath catheer  . Paroxysmal atrial fibrillation   . Syncope     s/p ILR no recurrent syncope and negative for arrhythmias last interrogation December 2012     No Known Allergies   Current Outpatient Prescriptions  Medication Sig Dispense Refill  . atorvastatin (LIPITOR) 80 MG tablet TAKE 1 BY MOUTH EVERY      EVENING 90 tablet 3  . Calcium Acetate 667 MG TABS Take 1 tablet by mouth daily.     . folic acid-vitamin b complex-vitamin c-selenium-zinc (DIALYVITE) 3 MG TABS Take 1 tablet by mouth daily.      Marland Kitchen levothyroxine (SYNTHROID, LEVOTHROID) 125 MCG tablet Take 1  tablet by mouth daily.    . metoprolol succinate (TOPROL-XL) 25 MG 24 hr tablet Take 0.5 tablets (12.5 mg total) by mouth daily. DOSE DECREASE 06/06/15 45 tablet 3  . NOVOLIN N 100 UNIT/ML injection Inject 35 Units into the skin daily.     Marland Kitchen omeprazole (PRILOSEC) 20 MG capsule Take 20 mg by mouth daily.     . polyethylene glycol powder (MIRALAX) powder Take 17 g by mouth as needed for mild constipation.     Marland Kitchen RENVELA 800 MG tablet Take 3,200 mg by mouth 2 (two) times daily.     . SENSIPAR 30 MG tablet Take 30 mg by mouth daily.     Gilman Schmidt TEST test strip 1 each by Other route as needed (blood sugar).     Marland Kitchen ULTICARE INSULIN SYRINGE 31G X 5/16" 0.5 ML MISC     . warfarin (COUMADIN) 2 MG tablet Take 0.5-1 tablets (1-2 mg total) by mouth daily. Alternating between  and  daily 45 tablet 3   No current facility-administered medications for this visit.     Past Surgical History  Procedure Laterality Date  . Cataract extraction    . Cholecystectomy    . Implantable loop recorder    . Cardiac stents    .  Peripheral vascular catheterization N/A 02/20/2015    Procedure: Abdominal Aortogram;  Surgeon: Fransisco Hertz, MD;  Location: [facility] INVAS[facility] LAB [facility]UP[facility];  Service: Cardiovascular;  Laterality: N/A;     No Known Allergies    Family History  Problem Relation Age of Onset  . Diabetes Other   . Hypertension Other   . Diabetes Mother   . Diabetes Father   . Diabetes Sister   . Hypertension Sister   . Cancer Brother   . Varicose Veins Daughter      Social History Mr. Funes reports that he quit smoking about 28 years ago. His smoking use included Cigars. He started smoking about 48 years ago. He has never used smokeless tobacco. Mr. Alewine reports that he does not drink alcohol.   Review of Systems CONSTITUTIONAL: No weight loss, fever, chills, weakness or fatigue.  HEENT: Eyes: No visual loss, blurred vision, double vision or yellow sclerae.No hearing loss, sneezing,  congestion, runny nose or sore throat.  SKIN: No rash or itching.  CARDIOVASCULAR: per hpi RESPIRATORY: No shortness of breath, cough or sputum.  GASTROINTESTINAL: No anorexia, nausea, vomiting or diarrhea. No abdominal pain or blood.  GENITOURINARY: No burning on urination, no polyuria NEUROLOGICAL: No headache, dizziness, syncope, paralysis, ataxia, numbness or tingling in the extremities. No change in bowel or bladder control.  MUSCULOSKELETAL: No muscle, back pain, joint pain or stiffness.  LYMPHATICS: No enlarged nodes. No history of splenectomy.  PSYCHIATRIC: No history of depression or anxiety.  ENDOCRINOLOGIC: No reports of sweating, cold or heat intolerance. No polyuria or polydipsia.  Marland Kitchen   Physical Examination Filed Vitals:   09/25/15 0810  BP: 104/67  Pulse: 89   Filed Vitals:   09/25/15 0810  Height:  (1.803 m)  Weight: 195 lb (88.451 kg)    Gen: resting comfortably, no acute distress HEENT: no scleral icterus, pupils equal round and reactive, no palptable cervical adenopathy,  CV: irreg, no m/r/g, no jvd Resp: Clear to auscultation bilaterally GI: abdomen is soft, non-tender, non-distended, normal bowel sounds, no hepatosplenomegaly MSK: extremities are warm, no edema.  Skin: warm, no rash Neuro:  no focal deficits Psych: appropriate affect   Diagnostic Studies 07/2009 Echo: :LVEF 35-40%, mild LVH, apical akinesis, grade I diastolic dyfuncttion, mod LAE  07/2009 Cath:  LAD occlusion, severe RCA disease  11/2014 echo Study Conclusions  - Procedure narrative: Transthoracic echocardiography for left ventricular function evaluation, for right ventricular function evaluation, and for assessment of valvular function. Image quality was fair. There was suboptimal endocardial visualization. - Left ventricle: There was severe concentric hypertrophy. Systolic function was normal. The estimated ejection fraction was in the range of 50% to 55%. The  apex and anteroapical wall appeared akinetic. The study was not technically sufficient to allow evaluation of LV diastolic dysfunction due to atrial fibrillation/flutter. Doppler parameters are consistent with high ventricular filling pressure. - Regional wall motion abnormality: Possible akinesis of the mid anteroseptal myocardium; possible severe hypokinesis of the mid anterior myocardium. Lateral wall not well visualized. - Aortic valve: Mildly calcified annulus. Trileaflet; mildly thickened leaflets. - Mitral valve: Moderate posterior mitral annular calcification. There was mild regurgitation. - Left atrium: The atrium was severely dilated. Volume/bsa, ES, (1-plane Simpson&'s, A2C): 54.2 ml/m^2. - Right ventricle: Systolic function was mildly reduced. - Right atrium: The atrium was mildly to moderately dilated. - Tricuspid valve: There was mild regurgitation. Pulmonary pressures mildly elevated: 41 mmHg. - Systemic veins: IVC is dilated with normal respiratory variation: estimated CVP 8 mmHg. -  Pericardium, extracardiac: A trivial pericardial effusion was identified.    Assessment and Plan  1. CAD  - medical therapy has been somewhat limited due to low bp's during dialysis. On low dose beta blocker only, no ACE-I - no current symptoms - continue current meds  2. Afib/Aflutter -no current symptoms  - continue rate control with Toprol - continue coumadin for stroke prophylaxis   3. Hyperlipidemia  - request most recent panel from [facility] - continue statin  4. ESRD - low bp's at times during HD, if progress would consider midodrine on HD days   F/u 6 months      Antoine PocheJonathan F. Tahirah Sara, M.D.

## 2015-10-10 ENCOUNTER — Ambulatory Visit (INDEPENDENT_AMBULATORY_CARE_PROVIDER_SITE_OTHER): Payer: Medicare Other | Admitting: *Deleted

## 2015-10-10 DIAGNOSIS — I4891 Unspecified atrial fibrillation: Secondary | ICD-10-CM

## 2015-10-10 DIAGNOSIS — Z5181 Encounter for therapeutic drug level monitoring: Secondary | ICD-10-CM | POA: Diagnosis not present

## 2015-10-10 LAB — POCT INR: INR: 1.9

## 2015-10-19 ENCOUNTER — Telehealth: Payer: Self-pay | Admitting: Cardiology

## 2015-10-19 NOTE — Telephone Encounter (Signed)
Patient walked in clinic - He had blood in his urine yesterday and did not take his coumadin. He wanted to know what he needed to do about his medication and how to take it

## 2015-10-19 NOTE — Telephone Encounter (Signed)
Left message for the patient to call in reference the phone message (walked into [facility] today & had blood in urine yesterday & did not take Coumadin).

## 2015-10-20 ENCOUNTER — Ambulatory Visit (INDEPENDENT_AMBULATORY_CARE_PROVIDER_SITE_OTHER): Payer: Medicare Other | Admitting: Pharmacist

## 2015-10-20 DIAGNOSIS — Z5181 Encounter for therapeutic drug level monitoring: Secondary | ICD-10-CM | POA: Diagnosis not present

## 2015-10-20 DIAGNOSIS — I4891 Unspecified atrial fibrillation: Secondary | ICD-10-CM

## 2015-10-20 LAB — POCT INR: INR: 2.1

## 2015-10-20 NOTE — Telephone Encounter (Signed)
See Anticoagulation encounter.

## 2015-10-20 NOTE — Telephone Encounter (Signed)
Spoke with pt and he states had hematuria on Wednesday and held his coumadin but did take coumadin yesterday and states he has had no further bleeding since held coumadin Spoke with [facility] and they will do INR check on him today and call results Pt states he can be at office in 30 minutes

## 2015-10-26 ENCOUNTER — Ambulatory Visit (INDEPENDENT_AMBULATORY_CARE_PROVIDER_SITE_OTHER): Payer: Medicare Other | Admitting: *Deleted

## 2015-10-26 DIAGNOSIS — Z5181 Encounter for therapeutic drug level monitoring: Secondary | ICD-10-CM | POA: Diagnosis not present

## 2015-10-26 DIAGNOSIS — I480 Paroxysmal atrial fibrillation: Secondary | ICD-10-CM | POA: Diagnosis not present

## 2015-10-26 DIAGNOSIS — I4891 Unspecified atrial fibrillation: Secondary | ICD-10-CM

## 2015-10-26 LAB — POCT INR: INR: 1.5

## 2015-11-07 ENCOUNTER — Ambulatory Visit (INDEPENDENT_AMBULATORY_CARE_PROVIDER_SITE_OTHER): Payer: Medicare Other | Admitting: Pharmacist

## 2015-11-07 DIAGNOSIS — Z5181 Encounter for therapeutic drug level monitoring: Secondary | ICD-10-CM | POA: Diagnosis not present

## 2015-11-07 DIAGNOSIS — I4891 Unspecified atrial fibrillation: Secondary | ICD-10-CM | POA: Diagnosis not present

## 2015-11-07 DIAGNOSIS — I480 Paroxysmal atrial fibrillation: Secondary | ICD-10-CM | POA: Diagnosis not present

## 2015-11-07 LAB — POCT INR: INR: 1.4

## 2015-11-16 ENCOUNTER — Ambulatory Visit (INDEPENDENT_AMBULATORY_CARE_PROVIDER_SITE_OTHER): Payer: Medicare Other | Admitting: *Deleted

## 2015-11-16 DIAGNOSIS — Z5181 Encounter for therapeutic drug level monitoring: Secondary | ICD-10-CM

## 2015-11-16 DIAGNOSIS — I4891 Unspecified atrial fibrillation: Secondary | ICD-10-CM | POA: Diagnosis not present

## 2015-11-16 LAB — POCT INR: INR: 1.7

## 2015-11-30 ENCOUNTER — Ambulatory Visit (INDEPENDENT_AMBULATORY_CARE_PROVIDER_SITE_OTHER): Payer: Medicare Other | Admitting: *Deleted

## 2015-11-30 DIAGNOSIS — Z5181 Encounter for therapeutic drug level monitoring: Secondary | ICD-10-CM

## 2015-11-30 DIAGNOSIS — I4891 Unspecified atrial fibrillation: Secondary | ICD-10-CM

## 2015-11-30 LAB — POCT INR: INR: 1.6

## 2015-12-14 ENCOUNTER — Ambulatory Visit (INDEPENDENT_AMBULATORY_CARE_PROVIDER_SITE_OTHER): Payer: Medicare Other | Admitting: *Deleted

## 2015-12-14 DIAGNOSIS — I4891 Unspecified atrial fibrillation: Secondary | ICD-10-CM

## 2015-12-14 DIAGNOSIS — Z5181 Encounter for therapeutic drug level monitoring: Secondary | ICD-10-CM | POA: Diagnosis not present

## 2015-12-14 LAB — POCT INR: INR: 2.2

## 2016-01-02 ENCOUNTER — Ambulatory Visit (INDEPENDENT_AMBULATORY_CARE_PROVIDER_SITE_OTHER): Payer: Medicare Other | Admitting: *Deleted

## 2016-01-02 DIAGNOSIS — I4891 Unspecified atrial fibrillation: Secondary | ICD-10-CM | POA: Diagnosis not present

## 2016-01-02 DIAGNOSIS — Z5181 Encounter for therapeutic drug level monitoring: Secondary | ICD-10-CM | POA: Diagnosis not present

## 2016-01-02 LAB — POCT INR: INR: 2.4

## 2016-01-30 ENCOUNTER — Ambulatory Visit (INDEPENDENT_AMBULATORY_CARE_PROVIDER_SITE_OTHER): Payer: Medicare Other | Admitting: *Deleted

## 2016-01-30 DIAGNOSIS — Z5181 Encounter for therapeutic drug level monitoring: Secondary | ICD-10-CM

## 2016-01-30 DIAGNOSIS — I4891 Unspecified atrial fibrillation: Secondary | ICD-10-CM

## 2016-01-30 LAB — POCT INR: INR: 3.3

## 2016-02-16 ENCOUNTER — Other Ambulatory Visit: Payer: Self-pay | Admitting: Cardiology

## 2016-02-20 ENCOUNTER — Ambulatory Visit (INDEPENDENT_AMBULATORY_CARE_PROVIDER_SITE_OTHER): Payer: Medicare Other | Admitting: *Deleted

## 2016-02-20 DIAGNOSIS — Z5181 Encounter for therapeutic drug level monitoring: Secondary | ICD-10-CM

## 2016-02-20 DIAGNOSIS — I4891 Unspecified atrial fibrillation: Secondary | ICD-10-CM | POA: Diagnosis not present

## 2016-02-20 LAB — POCT INR: INR: 2.2

## 2016-03-19 ENCOUNTER — Ambulatory Visit (INDEPENDENT_AMBULATORY_CARE_PROVIDER_SITE_OTHER): Payer: Medicare Other | Admitting: *Deleted

## 2016-03-19 DIAGNOSIS — Z5181 Encounter for therapeutic drug level monitoring: Secondary | ICD-10-CM | POA: Diagnosis not present

## 2016-03-19 DIAGNOSIS — I4891 Unspecified atrial fibrillation: Secondary | ICD-10-CM

## 2016-03-19 LAB — POCT INR: INR: 2.8

## 2016-03-28 ENCOUNTER — Other Ambulatory Visit: Payer: Self-pay | Admitting: Cardiology

## 2016-04-16 ENCOUNTER — Ambulatory Visit (INDEPENDENT_AMBULATORY_CARE_PROVIDER_SITE_OTHER): Payer: Medicare Other | Admitting: *Deleted

## 2016-04-16 DIAGNOSIS — Z5181 Encounter for therapeutic drug level monitoring: Secondary | ICD-10-CM

## 2016-04-16 DIAGNOSIS — I4891 Unspecified atrial fibrillation: Secondary | ICD-10-CM

## 2016-04-16 LAB — POCT INR: INR: 2.7

## 2016-04-17 ENCOUNTER — Other Ambulatory Visit: Payer: Self-pay | Admitting: Cardiology

## 2016-05-16 ENCOUNTER — Other Ambulatory Visit: Payer: Self-pay | Admitting: Cardiology

## 2016-05-22 ENCOUNTER — Ambulatory Visit: Payer: Medicare Other | Admitting: Cardiology

## 2016-05-24 ENCOUNTER — Encounter: Payer: Self-pay | Admitting: Cardiology

## 2016-05-24 ENCOUNTER — Ambulatory Visit (INDEPENDENT_AMBULATORY_CARE_PROVIDER_SITE_OTHER): Payer: Medicare Other | Admitting: Cardiology

## 2016-05-24 ENCOUNTER — Encounter: Payer: Self-pay | Admitting: *Deleted

## 2016-05-24 VITALS — BP 92/60 | HR 91 | Ht 71.0 in | Wt 189.0 lb

## 2016-05-24 DIAGNOSIS — I2510 Atherosclerotic heart disease of native coronary artery without angina pectoris: Secondary | ICD-10-CM | POA: Diagnosis not present

## 2016-05-24 DIAGNOSIS — E785 Hyperlipidemia, unspecified: Secondary | ICD-10-CM | POA: Diagnosis not present

## 2016-05-24 DIAGNOSIS — I4891 Unspecified atrial fibrillation: Secondary | ICD-10-CM | POA: Diagnosis not present

## 2016-05-24 NOTE — Patient Instructions (Signed)

## 2016-05-24 NOTE — Progress Notes (Signed)
Clinical Summary [name] is a 75 y.o.male seen today for follow up of the following medical problems.    1. CAD/ICM  - prior stenting to RCA in 2010.  11/2014 echo LVEF 50-55% - compliant with meds. No ACE-I due to low bp's while on HD. We recently had to cut back on his Toprol due to low bp's, He currently is only taking on evening after HD.   - denies any chest pain. No SOB or DOE - compliant with meds  2. Afib  - followed by Dr Johney Frame in EP clinic  - no recent palpitations.  No bleeding on coumadin   3. Hyperlipidemia  - compliant with high dose statin - reports recent labs with pcp  4. ESRD - on HD Tues, Thurs, Sat - notes some low bp's on HD at times Past Medical History:  Diagnosis Date  . Anemia    chronic  . CAD (coronary artery disease)    a. abnormal perfusion imaging study with multipe, large fixed  defects, EF39% b. EF 50-55% with apical akinesis by 2D echo. Status post coronary intervention with drug-eluting stent placement x3 October 2010 EF 40-45%   . Diabetes mellitus   . Hemodialysis adequacy testing (HCC)    through a Vas-cath catheer  . HTN (hypertension)   . Hypothyroidism   . Paroxysmal atrial fibrillation (HCC)   . Renal failure    chronic, on hemodialysis  . Syncope    s/p ILR no recurrent syncope and negative for arrhythmias last interrogation December 2012  . Ventricular ectopy    History of nonsustained ventricular tachycardia., Status post EP study negative for inducible ventricular tachycardia     No Known Allergies   Current Outpatient Prescriptions  Medication Sig Dispense Refill  . Calcium Acetate 667 MG TABS Take 1 tablet by mouth daily.     . folic acid-vitamin b complex-vitamin c-selenium-zinc (DIALYVITE) 3 MG TABS Take 1 tablet by mouth daily.      Marland Kitchen levothyroxine (SYNTHROID, LEVOTHROID) 150 MCG tablet Take 1 tablet by mouth daily.    Marland Kitchen LIPITOR 80 MG tablet TAKE 1 BY MOUTH EVERY      EVENING 90 tablet 3  .  NOVOLIN N 100 UNIT/ML injection Inject 35 Units into the skin daily.     Marland Kitchen omeprazole (PRILOSEC) 20 MG capsule Take 20 mg by mouth daily.     . polyethylene glycol powder (MIRALAX) powder Take 17 g by mouth as needed for mild constipation.     Marland Kitchen RENVELA 800 MG tablet Take 3,200 mg by mouth 2 (two) times daily.     . SENSIPAR 30 MG tablet Take 30 mg by mouth daily.     . TOPROL XL 25 MG 24 hr tablet TAKE 1/2 BY MOUTH DAILY    *DOSE DECREASE* 45 tablet 0  . TRUETRACK TEST test strip 1 each by Other route as needed (blood sugar).     Marland Kitchen ULTICARE INSULIN SYRINGE 31G X 5/16" 0.5 ML MISC     . warfarin (COUMADIN) 2 MG tablet TAKE 1/2 TO 1 TABLET BY MOUTH DAILY ALTERNATING BETWEEN 1MG  AND 2MG  DAILY 45 tablet 3   No current facility-administered medications for this visit.      Past Surgical History:  Procedure Laterality Date  . cardiac stents    . CATARACT EXTRACTION    . CHOLECYSTECTOMY    . implantable loop recorder    . PERIPHERAL VASCULAR CATHETERIZATION N/A 02/20/2015   Procedure: Abdominal Aortogram;  Surgeon: Fransisco Hertz, MD;  Location: [facility] INVASIVE CV LAB CUPID;  Service: Cardiovascular;  Laterality: N/A;     No Known Allergies    Family History  Problem Relation Age of Onset  . Diabetes Other   . Hypertension Other   . Diabetes Mother   . Diabetes Father   . Diabetes Sister   . Hypertension Sister   . Cancer Brother   . Varicose Veins Daughter      Social History [name] reports that he quit smoking about 29 years ago. His smoking use included Cigars. He started smoking about 49 years ago. He has a 40.00 pack-year smoking history. He has never used smokeless tobacco. [name] reports that he does not drink alcohol.   Review of Systems CONSTITUTIONAL: No weight loss, fever, chills, weakness or fatigue.  HEENT: Eyes: No visual loss, blurred vision, double vision or yellow sclerae.No hearing loss, sneezing, congestion, runny nose or sore throat.  SKIN: No rash or  itching.  CARDIOVASCULAR: per HPI RESPIRATORY: No shortness of breath, cough or sputum.  GASTROINTESTINAL: No anorexia, nausea, vomiting or diarrhea. No abdominal pain or blood.  GENITOURINARY: No burning on urination, no polyuria NEUROLOGICAL: No headache, dizziness, syncope, paralysis, ataxia, numbness or tingling in the extremities. No change in bowel or bladder control.  MUSCULOSKELETAL: No muscle, back pain, joint pain or stiffness.  LYMPHATICS: No enlarged nodes. No history of splenectomy.  PSYCHIATRIC: No history of depression or anxiety.  ENDOCRINOLOGIC: No reports of sweating, cold or heat intolerance. No polyuria or polydipsia.  Marland Kitchen   Physical Examination Vitals:   05/24/16 0816  BP: 92/60  Pulse: 91   Vitals:   05/24/16 0816  Weight: 189 lb (85.7 kg)  Height:  (1.803 m)    Gen: resting comfortably, no acute distress HEENT: no scleral icterus, pupils equal round and reactive, no palptable cervical adenopathy,  CV: RRR, no m/r/g, no jvd Resp: Clear to auscultation bilaterally GI: abdomen is soft, non-tender, non-distended, normal bowel sounds, no hepatosplenomegaly MSK: extremities are warm, no edema.  Skin: warm, no rash Neuro:  no focal deficits Psych: appropriate affect   Diagnostic Studies 07/2009 Echo: :LVEF 35-40%, mild LVH, apical akinesis, grade I diastolic dyfuncttion, mod LAE  07/2009 Cath:  LAD occlusion, severe RCA disease  11/2014 echo Study Conclusions  - Procedure narrative: Transthoracic echocardiography for left ventricular function evaluation, for right ventricular function evaluation, and for assessment of valvular function. Image quality was fair. There was suboptimal endocardial visualization. - Left ventricle: There was severe concentric hypertrophy. Systolic function was normal. The estimated ejection fraction was in the range of 50% to 55%. The apex and anteroapical wall appeared akinetic. The study was not  technically sufficient to allow evaluation of LV diastolic dysfunction due to atrial fibrillation/flutter. Doppler parameters are consistent with high ventricular filling pressure. - Regional wall motion abnormality: Possible akinesis of the mid anteroseptal myocardium; possible severe hypokinesis of the mid anterior myocardium. Lateral wall not well visualized. - Aortic valve: Mildly calcified annulus. Trileaflet; mildly thickened leaflets. - Mitral valve: Moderate posterior mitral annular calcification. There was mild regurgitation. - Left atrium: The atrium was severely dilated. Volume/bsa, ES, (1-plane Simpson&'s, A2C): 54.2 ml/m^2. - Right ventricle: Systolic function was mildly reduced. - Right atrium: The atrium was mildly to moderately dilated. - Tricuspid valve: There was mild regurgitation. Pulmonary pressures mildly elevated: 41 mmHg. - Systemic veins: IVC is dilated with normal respiratory variation: estimated CVP 8 mmHg. - Pericardium, extracardiac: A trivial pericardial effusion  was identified.      Assessment and Plan  1. CAD  - medical therapy has been somewhat limited due to low bp's during dialysis. On low dose beta blocker only, no ACE-I - no current symptoms. We will continue current meds.   2. Afib/Aflutter -no current symptoms  - continue coumadin for stroke prophylaxis, CHADS2Vasc is 5.   3. Hyperlipidemia  - continue statin - request labs from his dialysis center         Antoine Poche, M.D.

## 2016-05-28 ENCOUNTER — Ambulatory Visit (INDEPENDENT_AMBULATORY_CARE_PROVIDER_SITE_OTHER): Payer: Medicare Other | Admitting: *Deleted

## 2016-05-28 DIAGNOSIS — Z5181 Encounter for therapeutic drug level monitoring: Secondary | ICD-10-CM | POA: Diagnosis not present

## 2016-05-28 DIAGNOSIS — I4891 Unspecified atrial fibrillation: Secondary | ICD-10-CM

## 2016-05-28 LAB — POCT INR: INR: 1.6

## 2016-06-11 ENCOUNTER — Ambulatory Visit (INDEPENDENT_AMBULATORY_CARE_PROVIDER_SITE_OTHER): Payer: Medicare Other | Admitting: *Deleted

## 2016-06-11 DIAGNOSIS — Z5181 Encounter for therapeutic drug level monitoring: Secondary | ICD-10-CM

## 2016-06-11 DIAGNOSIS — I4891 Unspecified atrial fibrillation: Secondary | ICD-10-CM | POA: Diagnosis not present

## 2016-06-11 LAB — POCT INR: INR: 2.1

## 2016-07-09 ENCOUNTER — Ambulatory Visit (INDEPENDENT_AMBULATORY_CARE_PROVIDER_SITE_OTHER): Payer: Medicare Other | Admitting: *Deleted

## 2016-07-09 DIAGNOSIS — I4891 Unspecified atrial fibrillation: Secondary | ICD-10-CM | POA: Diagnosis not present

## 2016-07-09 DIAGNOSIS — Z5181 Encounter for therapeutic drug level monitoring: Secondary | ICD-10-CM

## 2016-07-09 LAB — POCT INR: INR: 1.9

## 2016-08-06 ENCOUNTER — Ambulatory Visit (INDEPENDENT_AMBULATORY_CARE_PROVIDER_SITE_OTHER): Payer: Medicare Other | Admitting: *Deleted

## 2016-08-06 DIAGNOSIS — Z5181 Encounter for therapeutic drug level monitoring: Secondary | ICD-10-CM

## 2016-08-06 DIAGNOSIS — I4891 Unspecified atrial fibrillation: Secondary | ICD-10-CM

## 2016-08-06 LAB — POCT INR: INR: 3.3

## 2016-08-29 ENCOUNTER — Ambulatory Visit (INDEPENDENT_AMBULATORY_CARE_PROVIDER_SITE_OTHER): Payer: Medicare Other | Admitting: *Deleted

## 2016-08-29 DIAGNOSIS — Z5181 Encounter for therapeutic drug level monitoring: Secondary | ICD-10-CM | POA: Diagnosis not present

## 2016-08-29 DIAGNOSIS — I4891 Unspecified atrial fibrillation: Secondary | ICD-10-CM

## 2016-08-29 LAB — POCT INR: INR: 1.7

## 2016-09-19 ENCOUNTER — Ambulatory Visit (INDEPENDENT_AMBULATORY_CARE_PROVIDER_SITE_OTHER): Payer: Medicare Other | Admitting: *Deleted

## 2016-09-19 DIAGNOSIS — I4891 Unspecified atrial fibrillation: Secondary | ICD-10-CM

## 2016-09-19 DIAGNOSIS — Z5181 Encounter for therapeutic drug level monitoring: Secondary | ICD-10-CM

## 2016-09-19 DIAGNOSIS — I2510 Atherosclerotic heart disease of native coronary artery without angina pectoris: Secondary | ICD-10-CM

## 2016-09-19 LAB — POCT INR: INR: 2.5

## 2016-10-10 ENCOUNTER — Ambulatory Visit (INDEPENDENT_AMBULATORY_CARE_PROVIDER_SITE_OTHER): Payer: Medicare Other | Admitting: *Deleted

## 2016-10-10 DIAGNOSIS — I2510 Atherosclerotic heart disease of native coronary artery without angina pectoris: Secondary | ICD-10-CM | POA: Diagnosis not present

## 2016-10-10 DIAGNOSIS — Z5181 Encounter for therapeutic drug level monitoring: Secondary | ICD-10-CM

## 2016-10-10 DIAGNOSIS — I4891 Unspecified atrial fibrillation: Secondary | ICD-10-CM | POA: Diagnosis not present

## 2016-10-10 LAB — POCT INR: INR: 1.8

## 2016-10-31 ENCOUNTER — Ambulatory Visit (INDEPENDENT_AMBULATORY_CARE_PROVIDER_SITE_OTHER): Payer: Medicare Other | Admitting: *Deleted

## 2016-10-31 DIAGNOSIS — I4891 Unspecified atrial fibrillation: Secondary | ICD-10-CM | POA: Diagnosis not present

## 2016-10-31 DIAGNOSIS — Z5181 Encounter for therapeutic drug level monitoring: Secondary | ICD-10-CM

## 2016-10-31 LAB — POCT INR: INR: 2.9

## 2016-11-14 ENCOUNTER — Ambulatory Visit (INDEPENDENT_AMBULATORY_CARE_PROVIDER_SITE_OTHER): Payer: Medicare Other | Admitting: *Deleted

## 2016-11-14 DIAGNOSIS — I4891 Unspecified atrial fibrillation: Secondary | ICD-10-CM | POA: Diagnosis not present

## 2016-11-14 DIAGNOSIS — Z5181 Encounter for therapeutic drug level monitoring: Secondary | ICD-10-CM | POA: Diagnosis not present

## 2016-11-14 LAB — POCT INR: INR: 2.4

## 2016-11-21 ENCOUNTER — Other Ambulatory Visit: Payer: Self-pay | Admitting: Cardiology

## 2016-12-12 ENCOUNTER — Ambulatory Visit (INDEPENDENT_AMBULATORY_CARE_PROVIDER_SITE_OTHER): Payer: Medicare Other | Admitting: *Deleted

## 2016-12-12 DIAGNOSIS — I4891 Unspecified atrial fibrillation: Secondary | ICD-10-CM

## 2016-12-12 DIAGNOSIS — Z5181 Encounter for therapeutic drug level monitoring: Secondary | ICD-10-CM

## 2016-12-12 LAB — POCT INR: INR: 2.8

## 2016-12-19 ENCOUNTER — Encounter: Payer: Self-pay | Admitting: *Deleted

## 2016-12-20 ENCOUNTER — Encounter: Payer: Self-pay | Admitting: *Deleted

## 2016-12-20 ENCOUNTER — Ambulatory Visit (INDEPENDENT_AMBULATORY_CARE_PROVIDER_SITE_OTHER): Payer: Medicare Other | Admitting: Cardiology

## 2016-12-20 ENCOUNTER — Encounter: Payer: Self-pay | Admitting: Cardiology

## 2016-12-20 VITALS — BP 118/65 | HR 85 | Ht 71.0 in | Wt 190.6 lb

## 2016-12-20 DIAGNOSIS — I2510 Atherosclerotic heart disease of native coronary artery without angina pectoris: Secondary | ICD-10-CM | POA: Diagnosis not present

## 2016-12-20 DIAGNOSIS — I4891 Unspecified atrial fibrillation: Secondary | ICD-10-CM

## 2016-12-20 DIAGNOSIS — E782 Mixed hyperlipidemia: Secondary | ICD-10-CM | POA: Diagnosis not present

## 2016-12-20 NOTE — Patient Instructions (Signed)

## 2016-12-20 NOTE — Progress Notes (Signed)
Clinical Summary [name] is a 76 y.o.male seen today for follow up of the following medical problems.    1. CAD/ICM  - prior stenting to RCA in 2010.  11/2014 echo LVEF 50-55%  - denies any chest pain. No SOB or DOE - compliant with meds. Soft bp's have limited medical dosing.   2. Afib  - followed by Dr Matthew Espinoza in EP clinic   - no recent palpitations. No bleeding troubles on coumadin.   3. Hyperlipidemia  - compliant with high dose statin - reports recent labs at Dialysis center.    4. ESRD - on HD Tues, Thurs, Sat - notes some occasional low bp's on HD at times   Past Medical History:  Diagnosis Date  . Anemia    chronic  . CAD (coronary artery disease)    a. abnormal perfusion imaging study with multipe, large fixed  defects, EF39% b. EF 50-55% with apical akinesis by 2D echo. Status post coronary intervention with drug-eluting stent placement x3 October 2010 EF 40-45%   . Diabetes mellitus   . Hemodialysis adequacy testing (HCC)    through a Vas-cath catheer  . HTN (hypertension)   . Hypothyroidism   . Paroxysmal atrial fibrillation (HCC)   . Renal failure    chronic, on hemodialysis  . Syncope    s/p ILR no recurrent syncope and negative for arrhythmias last interrogation December 2012  . Ventricular ectopy    History of nonsustained ventricular tachycardia., Status post EP study negative for inducible ventricular tachycardia     No Known Allergies   Current Outpatient Prescriptions  Medication Sig Dispense Refill  . Calcium Acetate 667 MG TABS Take 1 tablet by mouth daily.     . folic acid-vitamin b complex-vitamin c-selenium-zinc (DIALYVITE) 3 MG TABS Take 1 tablet by mouth daily.      Marland Kitchen. levothyroxine (SYNTHROID, LEVOTHROID) 150 MCG tablet Take 1 tablet by mouth daily.    Marland Kitchen. LIPITOR 80 MG tablet TAKE 1 BY MOUTH EVERY      EVENING 90 tablet 3  . NOVOLIN N 100 UNIT/ML injection Inject 35 Units into the skin daily.     Marland Kitchen. omeprazole  (PRILOSEC) 20 MG capsule Take 20 mg by mouth daily.     . polyethylene glycol powder (MIRALAX) powder Take 17 g by mouth as needed for mild constipation.     Marland Kitchen. RENVELA 800 MG tablet Take 3,200 mg by mouth 2 (two) times daily.     . SENSIPAR 30 MG tablet Take 30 mg by mouth daily.     . TOPROL XL 25 MG 24 hr tablet TAKE 1/2 BY MOUTH DAILY    *DOSE DECREASE* 45 tablet 0  . TRUETRACK TEST test strip 1 each by Other route as needed (blood sugar).     Marland Kitchen. ULTICARE INSULIN SYRINGE 31G X 5/16" 0.5 ML MISC     . warfarin (COUMADIN) 2 MG tablet TAKE 1/2 TO 1 TABLET BY MOUTH DAILY ALTERNATING BETWEEN 1MG  AND 2MG  DAILY 45 tablet 4   No current facility-administered medications for this visit.      Past Surgical History:  Procedure Laterality Date  . cardiac stents    . CATARACT EXTRACTION    . CHOLECYSTECTOMY    . implantable loop recorder    . PERIPHERAL VASCULAR CATHETERIZATION N/A 02/20/2015   Procedure: Abdominal Aortogram;  Surgeon: Matthew HertzBrian L Chen, MD;  Location: [facility] INVASIVE CV LAB CUPID;  Service: Cardiovascular;  Laterality: N/A;  No Known Allergies    Family History  Problem Relation Age of Onset  . Diabetes Mother   . Diabetes Father   . Diabetes Other   . Hypertension Other   . Diabetes Sister   . Hypertension Sister   . Cancer Brother   . Varicose Veins Daughter      Social History Matthew Espinoza reports that he quit smoking about 30 years ago. His smoking use included Cigars. He started smoking about 50 years ago. He has a 40.00 pack-year smoking history. He has never used smokeless tobacco. Matthew Espinoza reports that he does not drink alcohol.   Review of Systems CONSTITUTIONAL: No weight loss, fever, chills, weakness or fatigue.  HEENT: Eyes: No visual loss, blurred vision, double vision or yellow sclerae.No hearing loss, sneezing, congestion, runny nose or sore throat.  SKIN: No rash or itching.  CARDIOVASCULAR: per hpi RESPIRATORY: No shortness of breath, cough or sputum.    GASTROINTESTINAL: No anorexia, nausea, vomiting or diarrhea. No abdominal pain or blood.  GENITOURINARY: No burning on urination, no polyuria NEUROLOGICAL: No headache, dizziness, syncope, paralysis, ataxia, numbness or tingling in the extremities. No change in bowel or bladder control.  MUSCULOSKELETAL: No muscle, back pain, joint pain or stiffness.  LYMPHATICS: No enlarged nodes. No history of splenectomy.  PSYCHIATRIC: No history of depression or anxiety.  ENDOCRINOLOGIC: No reports of sweating, cold or heat intolerance. No polyuria or polydipsia.  Marland Kitchen   Physical Examination Vitals:   12/20/16 0821  BP: 118/65  Pulse: 85   Vitals:   12/20/16 0821  Weight: 190 lb 9.6 oz (86.5 kg)  Height: 5\' 11"  (1.803 m)     Gen: resting comfortably, no acute distress HEENT: no scleral icterus, pupils equal round and reactive, no palptable cervical adenopathy,  CV: RRR, no m/r/g no jvd Resp: Clear to auscultation bilaterally GI: abdomen is soft, non-tender, non-distended, normal bowel sounds, no hepatosplenomegaly MSK: extremities are warm, no edema.  Skin: warm, no rash Neuro:  no focal deficits Psych: appropriate affect   Diagnostic Studies 07/2009 Echo: :LVEF 35-40%, mild LVH, apical akinesis, grade I diastolic dyfuncttion, mod LAE  07/2009 Cath:  LAD occlusion, severe RCA disease  11/2014 echo Study Conclusions  - Procedure narrative: Transthoracic echocardiography for left ventricular function evaluation, for right ventricular function evaluation, and for assessment of valvular function. Image quality was fair. There was suboptimal endocardial visualization. - Left ventricle: There was severe concentric hypertrophy. Systolic function was normal. The estimated ejection fraction was in the range of 50% to 55%. The apex and anteroapical wall appeared akinetic. The study was not technically sufficient to allow evaluation of LV diastolic dysfunction due to  atrial fibrillation/flutter. Doppler parameters are consistent with high ventricular filling pressure. - Regional wall motion abnormality: Possible akinesis of the mid anteroseptal myocardium; possible severe hypokinesis of the mid anterior myocardium. Lateral wall not well visualized. - Aortic valve: Mildly calcified annulus. Trileaflet; mildly thickened leaflets. - Mitral valve: Moderate posterior mitral annular calcification. There was mild regurgitation. - Left atrium: The atrium was severely dilated. Volume/bsa, ES, (1-plane Simpson&'s, A2C): 54.2 ml/m^2. - Right ventricle: Systolic function was mildly reduced. - Right atrium: The atrium was mildly to moderately dilated. - Tricuspid valve: There was mild regurgitation. Pulmonary pressures mildly elevated: 41 mmHg. - Systemic veins: IVC is dilated with normal respiratory variation: estimated CVP 8 mmHg. - Pericardium, extracardiac: A trivial pericardial effusion was identified.    Assessment and Plan  1. CAD  - medical therapy has been somewhat  limited due to low bp's during dialysis.  - no recent symptoms - continue current meds  2. Afib/Aflutter -denies any current symptoms  - continue coumadin for stroke prophylaxis, CHADS2Vasc is 5.   3. Hyperlipidemia  - continue statin - we will request labs from his dialysis center       Matthew Espinoza, M.D.

## 2017-01-09 ENCOUNTER — Ambulatory Visit (INDEPENDENT_AMBULATORY_CARE_PROVIDER_SITE_OTHER): Payer: Medicare Other | Admitting: *Deleted

## 2017-01-09 DIAGNOSIS — I2510 Atherosclerotic heart disease of native coronary artery without angina pectoris: Secondary | ICD-10-CM

## 2017-01-09 DIAGNOSIS — I4891 Unspecified atrial fibrillation: Secondary | ICD-10-CM | POA: Diagnosis not present

## 2017-01-09 DIAGNOSIS — Z5181 Encounter for therapeutic drug level monitoring: Secondary | ICD-10-CM | POA: Diagnosis not present

## 2017-01-09 LAB — POCT INR
INR: 3.9
INR: 3.9

## 2017-01-10 ENCOUNTER — Inpatient Hospital Stay (HOSPITAL_COMMUNITY)
Admission: AD | Admit: 2017-01-10 | Discharge: 2017-01-13 | DRG: 813 | Disposition: A | Discharge status: Home / Self Care | Payer: Medicare Other | Source: Other Acute Inpatient Hospital | Attending: Internal Medicine | Admitting: Internal Medicine

## 2017-01-10 ENCOUNTER — Encounter (HOSPITAL_COMMUNITY): Payer: Self-pay | Admitting: Internal Medicine

## 2017-01-10 DIAGNOSIS — Z955 Presence of coronary angioplasty implant and graft: Secondary | ICD-10-CM

## 2017-01-10 DIAGNOSIS — I2510 Atherosclerotic heart disease of native coronary artery without angina pectoris: Secondary | ICD-10-CM | POA: Diagnosis present

## 2017-01-10 DIAGNOSIS — Z9049 Acquired absence of other specified parts of digestive tract: Secondary | ICD-10-CM | POA: Diagnosis not present

## 2017-01-10 DIAGNOSIS — I120 Hypertensive chronic kidney disease with stage 5 chronic kidney disease or end stage renal disease: Secondary | ICD-10-CM | POA: Diagnosis present

## 2017-01-10 DIAGNOSIS — I482 Chronic atrial fibrillation: Secondary | ICD-10-CM

## 2017-01-10 DIAGNOSIS — D62 Acute posthemorrhagic anemia: Secondary | ICD-10-CM | POA: Diagnosis present

## 2017-01-10 DIAGNOSIS — R55 Syncope and collapse: Secondary | ICD-10-CM | POA: Diagnosis not present

## 2017-01-10 DIAGNOSIS — S20212A Contusion of left front wall of thorax, initial encounter: Secondary | ICD-10-CM | POA: Diagnosis present

## 2017-01-10 DIAGNOSIS — Z794 Long term (current) use of insulin: Secondary | ICD-10-CM

## 2017-01-10 DIAGNOSIS — E861 Hypovolemia: Secondary | ICD-10-CM | POA: Diagnosis present

## 2017-01-10 DIAGNOSIS — E1122 Type 2 diabetes mellitus with diabetic chronic kidney disease: Secondary | ICD-10-CM | POA: Diagnosis present

## 2017-01-10 DIAGNOSIS — Z79899 Other long term (current) drug therapy: Secondary | ICD-10-CM | POA: Diagnosis not present

## 2017-01-10 DIAGNOSIS — N2581 Secondary hyperparathyroidism of renal origin: Secondary | ICD-10-CM | POA: Diagnosis present

## 2017-01-10 DIAGNOSIS — E785 Hyperlipidemia, unspecified: Secondary | ICD-10-CM | POA: Diagnosis present

## 2017-01-10 DIAGNOSIS — W010XXA Fall on same level from slipping, tripping and stumbling without subsequent striking against object, initial encounter: Secondary | ICD-10-CM | POA: Diagnosis present

## 2017-01-10 DIAGNOSIS — E8889 Other specified metabolic disorders: Secondary | ICD-10-CM | POA: Diagnosis present

## 2017-01-10 DIAGNOSIS — S2020XA Contusion of thorax, unspecified, initial encounter: Secondary | ICD-10-CM | POA: Diagnosis not present

## 2017-01-10 DIAGNOSIS — I959 Hypotension, unspecified: Secondary | ICD-10-CM | POA: Diagnosis present

## 2017-01-10 DIAGNOSIS — N186 End stage renal disease: Secondary | ICD-10-CM | POA: Diagnosis present

## 2017-01-10 DIAGNOSIS — S301XXD Contusion of abdominal wall, subsequent encounter: Secondary | ICD-10-CM | POA: Diagnosis not present

## 2017-01-10 DIAGNOSIS — Z87891 Personal history of nicotine dependence: Secondary | ICD-10-CM | POA: Diagnosis not present

## 2017-01-10 DIAGNOSIS — Z8249 Family history of ischemic heart disease and other diseases of the circulatory system: Secondary | ICD-10-CM | POA: Diagnosis not present

## 2017-01-10 DIAGNOSIS — D631 Anemia in chronic kidney disease: Secondary | ICD-10-CM | POA: Diagnosis present

## 2017-01-10 DIAGNOSIS — D6832 Hemorrhagic disorder due to extrinsic circulating anticoagulants: Principal | ICD-10-CM | POA: Diagnosis present

## 2017-01-10 DIAGNOSIS — Z833 Family history of diabetes mellitus: Secondary | ICD-10-CM | POA: Diagnosis not present

## 2017-01-10 DIAGNOSIS — K219 Gastro-esophageal reflux disease without esophagitis: Secondary | ICD-10-CM | POA: Diagnosis present

## 2017-01-10 DIAGNOSIS — D689 Coagulation defect, unspecified: Secondary | ICD-10-CM | POA: Diagnosis not present

## 2017-01-10 DIAGNOSIS — E039 Hypothyroidism, unspecified: Secondary | ICD-10-CM | POA: Diagnosis present

## 2017-01-10 DIAGNOSIS — S301XXA Contusion of abdominal wall, initial encounter: Secondary | ICD-10-CM

## 2017-01-10 DIAGNOSIS — Z992 Dependence on renal dialysis: Secondary | ICD-10-CM

## 2017-01-10 LAB — CBC WITH DIFFERENTIAL/PLATELET
BASOS ABS: 0 10*3/uL (ref 0.0–0.1)
Basophils Relative: 0 %
Eosinophils Absolute: 0 10*3/uL (ref 0.0–0.7)
Eosinophils Relative: 0 %
HEMATOCRIT: 25.8 % — AB (ref 39.0–52.0)
HEMOGLOBIN: 8.3 g/dL — AB (ref 13.0–17.0)
LYMPHS PCT: 18 %
Lymphs Abs: 2 10*3/uL (ref 0.7–4.0)
MCH: 30.2 pg (ref 26.0–34.0)
MCHC: 32.2 g/dL (ref 30.0–36.0)
MCV: 93.8 fL (ref 78.0–100.0)
Monocytes Absolute: 0.9 10*3/uL (ref 0.1–1.0)
Monocytes Relative: 8 %
NEUTROS ABS: 8.1 10*3/uL — AB (ref 1.7–7.7)
NEUTROS PCT: 74 %
Platelets: 141 10*3/uL — ABNORMAL LOW (ref 150–400)
RBC: 2.75 MIL/uL — ABNORMAL LOW (ref 4.22–5.81)
RDW: 14.6 % (ref 11.5–15.5)
WBC: 11 10*3/uL — ABNORMAL HIGH (ref 4.0–10.5)

## 2017-01-10 LAB — BASIC METABOLIC PANEL
ANION GAP: 13 (ref 5–15)
BUN: 23 mg/dL — ABNORMAL HIGH (ref 6–20)
CO2: 28 mmol/L (ref 22–32)
Calcium: 9.4 mg/dL (ref 8.9–10.3)
Chloride: 96 mmol/L — ABNORMAL LOW (ref 101–111)
Creatinine, Ser: 5.08 mg/dL — ABNORMAL HIGH (ref 0.61–1.24)
GFR calc Af Amer: 12 mL/min — ABNORMAL LOW (ref >60)
GFR calc non Af Amer: 10 mL/min — ABNORMAL LOW (ref >60)
Glucose, Bld: 320 mg/dL — ABNORMAL HIGH (ref 65–99)
POTASSIUM: 3.8 mmol/L (ref 3.5–5.1)
Sodium: 137 mmol/L (ref 135–145)

## 2017-01-10 LAB — ABO/RH: ABO/RH(D): A POS

## 2017-01-10 LAB — GLUCOSE, CAPILLARY
GLUCOSE-CAPILLARY: 327 mg/dL — AB (ref 65–99)
Glucose-Capillary: 300 mg/dL — ABNORMAL HIGH (ref 65–99)
Glucose-Capillary: 226 mg/dL — ABNORMAL HIGH (ref 65–99)

## 2017-01-10 LAB — HEMOGLOBIN AND HEMATOCRIT, BLOOD
HCT: 24.6 % — ABNORMAL LOW (ref 39.0–52.0)
Hemoglobin: 7.8 g/dL — ABNORMAL LOW (ref 13.0–17.0)

## 2017-01-10 LAB — PROTIME-INR
INR: 2.62
Prothrombin Time: 28.6 seconds — ABNORMAL HIGH (ref 11.4–15.2)

## 2017-01-10 LAB — TROPONIN I: TROPONIN I: 0.07 ng/mL — AB (ref <0.03)

## 2017-01-10 MED ORDER — LIDOCAINE-PRILOCAINE 2.5-2.5 % EX CREA
1.0000 "application " | TOPICAL_CREAM | CUTANEOUS | Status: DC | PRN
  Filled 2017-01-10: qty 5

## 2017-01-10 MED ORDER — CALCIUM ACETATE 667 MG PO TABS: 1.0000 | ORAL_TABLET | Freq: Every day | ORAL | Status: DC

## 2017-01-10 MED ORDER — METOPROLOL SUCCINATE ER 25 MG PO TB24: 12.5000 mg | ORAL_TABLET | Freq: Every day | ORAL | Status: DC

## 2017-01-10 MED ORDER — SODIUM CHLORIDE 0.9 % IV BOLUS (SEPSIS)
500.0000 mL | Freq: Once | INTRAVENOUS | Status: AC
  Administered 2017-01-10: 500 mL via INTRAVENOUS

## 2017-01-10 MED ORDER — ONDANSETRON HCL 4 MG/2ML IJ SOLN: 4.0000 mg | Freq: Four times a day (QID) | INTRAMUSCULAR | Status: DC | PRN

## 2017-01-10 MED ORDER — DARBEPOETIN ALFA 25 MCG/0.42ML IJ SOSY
25.0000 ug | PREFILLED_SYRINGE | INTRAMUSCULAR | Status: DC
  Administered 2017-01-11: 25 ug via INTRAVENOUS
  Filled 2017-01-10: qty 0.42

## 2017-01-10 MED ORDER — GLUCOSE BLOOD VI STRP: 1.0000 | ORAL_STRIP | Status: DC | PRN

## 2017-01-10 MED ORDER — DOXERCALCIFEROL 4 MCG/2ML IV SOLN
2.0000 ug | INTRAVENOUS | Status: DC
  Administered 2017-01-11: 2 ug via INTRAVENOUS
  Filled 2017-01-10: qty 2

## 2017-01-10 MED ORDER — PANTOPRAZOLE SODIUM 40 MG PO TBEC
40.0000 mg | DELAYED_RELEASE_TABLET | Freq: Every day | ORAL | Status: DC
  Administered 2017-01-11 – 2017-01-13 (×3): 40 mg via ORAL
  Filled 2017-01-10 (×3): qty 1

## 2017-01-10 MED ORDER — SODIUM CHLORIDE 0.9 % IV SOLN: 100.0000 mL | INTRAVENOUS | Status: DC | PRN

## 2017-01-10 MED ORDER — RENA-VITE PO TABS
1.0000 | ORAL_TABLET | Freq: Every day | ORAL | Status: DC
  Administered 2017-01-10 – 2017-01-12 (×3): 1 via ORAL
  Filled 2017-01-10 (×3): qty 1

## 2017-01-10 MED ORDER — ACETAMINOPHEN 325 MG PO TABS: 650.0000 mg | ORAL_TABLET | Freq: Four times a day (QID) | ORAL | Status: DC | PRN

## 2017-01-10 MED ORDER — LIDOCAINE HCL (PF) 1 % IJ SOLN: 5.0000 mL | INTRAMUSCULAR | Status: DC | PRN

## 2017-01-10 MED ORDER — SEVELAMER CARBONATE 800 MG PO TABS
3200.0000 mg | ORAL_TABLET | Freq: Two times a day (BID) | ORAL | Status: DC
  Administered 2017-01-10 – 2017-01-12 (×5): 3200 mg via ORAL
  Filled 2017-01-10 (×6): qty 4

## 2017-01-10 MED ORDER — SODIUM CHLORIDE 0.9% FLUSH
3.0000 mL | Freq: Two times a day (BID) | INTRAVENOUS | Status: DC
  Administered 2017-01-10 – 2017-01-12 (×5): 3 mL via INTRAVENOUS

## 2017-01-10 MED ORDER — ACETAMINOPHEN 650 MG RE SUPP: 650.0000 mg | Freq: Four times a day (QID) | RECTAL | Status: DC | PRN

## 2017-01-10 MED ORDER — SODIUM CHLORIDE 0.9% FLUSH
3.0000 mL | Freq: Two times a day (BID) | INTRAVENOUS | Status: DC
  Administered 2017-01-11 – 2017-01-13 (×4): 3 mL via INTRAVENOUS

## 2017-01-10 MED ORDER — ATORVASTATIN CALCIUM 80 MG PO TABS
80.0000 mg | ORAL_TABLET | Freq: Every day | ORAL | Status: DC
  Administered 2017-01-10 – 2017-01-12 (×2): 80 mg via ORAL
  Filled 2017-01-10 (×2): qty 1

## 2017-01-10 MED ORDER — ALTEPLASE 2 MG IJ SOLR: 2.0000 mg | Freq: Once | INTRAMUSCULAR | Status: DC | PRN

## 2017-01-10 MED ORDER — CALCIUM ACETATE (PHOS BINDER) 667 MG PO CAPS
667.0000 mg | ORAL_CAPSULE | Freq: Every day | ORAL | Status: DC
  Administered 2017-01-11: 667 mg via ORAL
  Filled 2017-01-10: qty 1

## 2017-01-10 MED ORDER — ONDANSETRON HCL 4 MG PO TABS: 4.0000 mg | ORAL_TABLET | Freq: Four times a day (QID) | ORAL | Status: DC | PRN

## 2017-01-10 MED ORDER — POLYETHYLENE GLYCOL 3350 17 GM/SCOOP PO POWD
17.0000 g | ORAL | Status: DC | PRN
  Filled 2017-01-10: qty 255

## 2017-01-10 MED ORDER — SODIUM CHLORIDE 0.9 % IV SOLN: 250.0000 mL | INTRAVENOUS | Status: DC | PRN

## 2017-01-10 MED ORDER — INSULIN ASPART 100 UNIT/ML ~~LOC~~ SOLN
0.0000 [IU] | Freq: Three times a day (TID) | SUBCUTANEOUS | Status: DC
  Administered 2017-01-10: 5 [IU] via SUBCUTANEOUS
  Administered 2017-01-10: 7 [IU] via SUBCUTANEOUS
  Administered 2017-01-11: 5 [IU] via SUBCUTANEOUS
  Administered 2017-01-11: 2 [IU] via SUBCUTANEOUS
  Administered 2017-01-12 (×3): 3 [IU] via SUBCUTANEOUS
  Administered 2017-01-13: 1 [IU] via SUBCUTANEOUS

## 2017-01-10 MED ORDER — PENTAFLUOROPROP-TETRAFLUOROETH EX AERO: 1.0000 "application " | INHALATION_SPRAY | CUTANEOUS | Status: DC | PRN

## 2017-01-10 MED ORDER — INSULIN NPH (HUMAN) (ISOPHANE) 100 UNIT/ML ~~LOC~~ SUSP
15.0000 [IU] | Freq: Every day | SUBCUTANEOUS | Status: DC
Start: 2017-01-11 — End: 2017-01-13
  Administered 2017-01-11 – 2017-01-13 (×3): 15 [IU] via SUBCUTANEOUS
  Filled 2017-01-10 (×2): qty 10

## 2017-01-10 MED ORDER — LEVOTHYROXINE SODIUM 75 MCG PO TABS
150.0000 ug | ORAL_TABLET | Freq: Every day | ORAL | Status: DC
  Administered 2017-01-11 – 2017-01-13 (×3): 150 ug via ORAL
  Filled 2017-01-10 (×3): qty 2

## 2017-01-10 MED ORDER — CINACALCET HCL 30 MG PO TABS
30.0000 mg | ORAL_TABLET | Freq: Every day | ORAL | Status: DC
  Administered 2017-01-11 – 2017-01-12 (×3): 30 mg via ORAL
  Filled 2017-01-10 (×3): qty 1

## 2017-01-10 MED ORDER — SODIUM CHLORIDE 0.9% FLUSH: 3.0000 mL | INTRAVENOUS | Status: DC | PRN

## 2017-01-10 MED ORDER — INSULIN NPH (HUMAN) (ISOPHANE) 100 UNIT/ML ~~LOC~~ SUSP: 35.0000 [IU] | Freq: Every day | SUBCUTANEOUS | Status: DC

## 2017-01-10 NOTE — Consult Note (Signed)
[facility] Renal Consultation Note    Indication for Consultation:  Management of ESRD/hemodialysis; anemia, hypertension/volume and secondary hyperparathyroidism Referring physician: Coralie Keens, MD PCP: Wyvonnia Lora, MD   HPI: Matthew Espinoza is a 76 y.o. male with ESRD secondary to DM on TTS schedule with [facility] followed by Dr. Fausto Skillern.  He has been on dialysis for eight years with "perfect attendance" with PMHx also significant for prior HTN,  PAF on coumadin and MTP (takes one every 3 days or so because if he takes it more often his BP gets too low,  CAD s/p stent x 3, hypothyroidism who lives alone. He formerly worked as an Journalist, newspaper and ran a Arboriculturist.  He tripped yesterday and fell on his left side with subsequent pain and swelling in his left posterior thorax area.  He had some dizziness and almost passed out.  EMS was called. He was hypotensive and taken to ED at [facility] with BP in the 70s per admitting note.  He was given an IV bolus and transferred due to the unavailability of dialysis. INR was noted to be 3.8 and hgb 9.1.    He has been dialyzing uneventfully except for BP drops at times.  He states, they allow it to get into the upper 90s.  The last post HD was 84.2 and before thatn 84.1.  His dry weight is 84 and he runs 4 hours.  The temperature per pt is reduced to 35 to keep BP up. He has had no recent problems with his AVF.  Currently he has pain in left lateral thoracic area. He denies SOB at rest.  He makes small amounts of urine. He has no N,V, diarrhea, fevers, CP. He is prone to dizziness and BP drops, left side hurts to take a breath and move.  Nice man- says usually no issues with HD  Past Medical History:  Diagnosis Date  . Anemia    chronic  . CAD (coronary artery disease)    a. abnormal perfusion imaging study with multipe, large fixed  defects, EF39% b. EF 50-55% with apical akinesis by 2D echo. Status post  coronary intervention with drug-eluting stent placement x3 October 2010 EF 40-45%   . Diabetes mellitus   . Hemodialysis adequacy testing (HCC)    through a Vas-cath catheer  . HTN (hypertension)   . Hypothyroidism   . Paroxysmal atrial fibrillation (HCC)   . Renal failure    chronic, on hemodialysis  . Syncope    s/p ILR no recurrent syncope and negative for arrhythmias last interrogation December 2012  . Ventricular ectopy    History of nonsustained ventricular tachycardia., Status post EP study negative for inducible ventricular tachycardia   Past Surgical History:  Procedure Laterality Date  . cardiac stents    . CATARACT EXTRACTION    . CHOLECYSTECTOMY    . implantable loop recorder    . PERIPHERAL VASCULAR CATHETERIZATION N/A 02/20/2015   Procedure: Abdominal Aortogram;  Surgeon: Fransisco Hertz, MD;  [facility]UP[facility];  Service: Cardiovascular;  Laterality: N/A;   Family History  Problem Relation Age of Onset  . Diabetes Mother   . Diabetes Father   . Diabetes Other   . Hypertension Other   . Diabetes Sister   . Hypertension Sister   . Cancer Brother   . Varicose Veins Daughter    Social History:  reports that he quit smoking about 30 years ago. His smoking use  included Cigars. He started smoking about 50 years ago. He has a 40.00 pack-year smoking history. He has never used smokeless tobacco. He reports that he does not drink alcohol or use drugs. No Known Allergies Prior to Admission medications   Medication Sig Start Date End Date Taking? Authorizing Provider  Calcium Acetate 667 MG TABS Take 1 tablet by mouth daily.     Historical Provider, MD  folic acid-vitamin b complex-vitamin c-selenium-zinc (DIALYVITE) 3 MG TABS Take 1 tablet by mouth daily.      Historical Provider, MD  levothyroxine (SYNTHROID, LEVOTHROID) 150 MCG tablet Take 150 mcg by mouth daily.  09/11/15   Historical Provider, MD  LIPITOR 80 MG tablet TAKE 1 BY MOUTH EVERY      EVENING  04/17/16   Antoine Poche, MD  NOVOLIN N 100 UNIT/ML injection Inject 35 Units into the skin daily.  07/19/13   Historical Provider, MD  omeprazole (PRILOSEC) 20 MG capsule Take 20 mg by mouth daily.  10/01/13   Historical Provider, MD  polyethylene glycol powder (MIRALAX) powder Take 17 g by mouth as needed for mild constipation.     Historical Provider, MD  RENVELA 800 MG tablet Take 3,200 mg by mouth 2 (two) times daily.  12/05/14   Historical Provider, MD  SENSIPAR 30 MG tablet Take 30 mg by mouth daily.  02/05/13   Historical Provider, MD  TOPROL XL 25 MG 24 hr tablet TAKE 1/2 BY MOUTH DAILY    *DOSE DECREASE* 03/28/16   Antoine Poche, MD  warfarin (COUMADIN) 2 MG tablet TAKE 1/2 TO 1 TABLET BY MOUTH DAILY ALTERNATING BETWEEN 1MG  AND 2MG  DAILY 11/21/16   Jonelle Sidle, MD   Current Facility-Administered Medications  Medication Dose Route Frequency Provider Last Rate Last Dose  . 0.9 %  sodium chloride infusion  250 mL Intravenous PRN Coralie Keens, MD      . acetaminophen (TYLENOL) tablet 650 mg  650 mg Oral Q6H PRN Coralie Keens, MD       Or  . acetaminophen (TYLENOL) suppository 650 mg  650 mg Rectal Q6H PRN Coralie Keens, MD      . atorvastatin (LIPITOR) tablet 80 mg  80 mg Oral q1800 Mauricio Annett Gula, MD      . Calcium Acetate TABS 1 tablet  1 tablet Oral Daily Mauricio Annett Gula, MD      . cinacalcet [facility]) tablet 30 mg  30 mg Oral Daily Mauricio Annett Gula, MD      . folic acid-vitamin b complex-vitamin c-selenium-zinc (DIALYVITE) tablet 1 tablet  1 tablet Oral Daily Mauricio Annett Gula, MD      . insulin aspart (novoLOG) injection 0-9 Units  0-9 Units Subcutaneous TID WC Coralie Keens, MD   5 Units at 01/10/17 1244  . insulin NPH Human (HUMULIN N,NOVOLIN N) injection 15 Units  15 Units Subcutaneous QAC breakfast Mauricio Annett Gula, MD      . Melene Muller ON 01/11/2017] levothyroxine (SYNTHROID, LEVOTHROID) tablet 150 mcg  150  mcg Oral QAC breakfast Mauricio Annett Gula, MD      . metoprolol succinate (TOPROL-XL) 24 hr tablet 12.5 mg  12.5 mg Oral Daily Mauricio Annett Gula, MD      . ondansetron [facility]) tablet 4 mg  4 mg Oral Q6H PRN Coralie Keens, MD       Or  . ondansetron [facility]) injection 4 mg  4 mg Intravenous Q6H PRN Mauricio Annett Gula, MD      . [  START ON 01/11/2017] pantoprazole (PROTONIX) EC tablet 40 mg  40 mg Oral Daily Mauricio Annett Gula, MD      . polyethylene glycol powder (GLYCOLAX/MIRALAX) container 17 g  17 g Oral PRN Coralie Keens, MD      . sevelamer carbonate (RENVELA) tablet 3,200 mg  3,200 mg Oral BID Coralie Keens, MD      . sodium chloride flush (NS) 0.9 % injection 3 mL  3 mL Intravenous Q12H Mauricio Annett Gula, MD      . sodium chloride flush (NS) 0.9 % injection 3 mL  3 mL Intravenous Q12H Mauricio Annett Gula, MD      . sodium chloride flush (NS) 0.9 % injection 3 mL  3 mL Intravenous PRN Mauricio Annett Gula, MD       Labs: Basic Metabolic Panel: No results for input(s): NA, K, CL, CO2, GLUCOSE, BUN, CREATININE, CALCIUM, PHOS in the last 168 hours.  Invalid input(s): ALB Liver Function Tests: No results for input(s): AST, ALT, ALKPHOS, BILITOT, PROT, ALBUMIN in the last 168 hours. No results for input(s): LIPASE, AMYLASE in the last 168 hours. No results for input(s): AMMONIA in the last 168 hours. CBC: No results for input(s): WBC, NEUTROABS, HGB, HCT, MCV, PLT in the last 168 hours. Cardiac Enzymes: No results for input(s): CKTOTAL, CKMB, CKMBINDEX, TROPONINI in the last 168 hours. CBG:  Recent Labs Lab 01/10/17 1117  GLUCAP 300*   ROS: As per HPI otherwise negative.  Physical Exam: Vitals:   01/10/17 1045  BP: (!) 95/58  Pulse: (!) 120  Resp: 19  Temp: 98.2 F (36.8 C)  TempSrc: Oral  SpO2: 99%     General: Healthy appearing pleasant WM, NAD breathing easily on room air Head: NCAT sclera not icteric MMM Neck:  Supple.  Lungs:  Right lung clear, left dim BS swelling in left thoracic area with ecchymosis. Heart: irreg irreg Abdomen: soft NT + BS Lower extremities: without edema or ischemic changes, no open wounds  Neuro: A & O  X 3. Moves all extremities spontaneously. Psych:  Responds to questions appropriately with a normal affect. Dialysis Access: right upper AVF + bruit  Dialysis Orders: [facility] 2 K 2.5 Ca 450/600 NO HEPARIN 4 hr EDW 84 temp 35  right upper AVF Pre HD BP 3/23 was 150s/80s sitting and standing and 99/55 sitting and 100/86 standing P 86--pre HD weight was 86 with post 84.2 Epogen 1000, Zone Bar, hectorol 2 sensipar 30  Recent labs: hgb 10.3 3/13 Hep B sAG 2/22 neg and HBsAB 3 07/2016  Assessment/Plan: 1. Left hemothroax- secondary to fall with slightly elevated INR 2. ESRD -  TTS via AVF - hold heparin -HD in am first round if space avail per pt request- 3. BP/volume  - keep BP >95 - standing wts with orthostatics (review of last tmt sheet showed BP ranged from 90 - 140s systolic -pulse generally in the 90s  4. Anemia  - hgb 9.1 per admitting note - last outpatient Hgb was 10.3 on 3/13- for recheck today and follow trend - only on low dose EPO - will give Aranesp tomorrow, check Fe studies 5. Metabolic bone disease -  On 4 renvela with meals, hectorol  2and sensipar 30 6. Nutrition - renal carb mod/vit 7. DM- per primary BS elevated 8. AFib - INR 3.9 - on MTP 1/2 25 mg per day but doesn't take regularly- not sure what baseline HR is though BP was 118/65 and P 85 at  3/2 OV with cardiology  Matthew SliderMartha B Bergman, PA-C [facility] Beeper [phone] 01/10/2017, 2:16 PM   Patient seen and examined, agree with above note with above modifications. 76 year old WM with medical problems to include ESRD- also on coumadin-0 had a fall and has a chest/abdominal wall bruise.  We will provide his regular HD treatments and will watch his hgb - continue ESA Annie SableKellie Dezman Granda,  MD 01/10/2017

## 2017-01-10 NOTE — Progress Notes (Signed)
76 yo male, [facility], due to coagulopathy due to vitamin K antagonist, complicated be a large muscular hematoma at the left flank (15-6-18 cm). Patient has atrial fibrillation and ESRD on HD Tue-Thus-Sat.([facility] Sustained a mechanical fall 01/09/17 (11:00pm). Blood pressure 90 systolic with heart rate 98 to 108. Good mentation and not in distress. INR 3,8 and Hvb 9.1.

## 2017-01-10 NOTE — Progress Notes (Signed)
[facility]. Patient is going to [facility]. Family members are with patient and are aware of patient's transfer to 4N. Belongings sent with patient. Mardella Nuckles, Drinda Buttsharito Joselita, RCharity fundraiser

## 2017-01-10 NOTE — H&P (Signed)
History and Physical    Matthew QuarryJames G Roger NWG:[id number] DOB: November 16, 1940 DOA: 01/10/2017  PCP: Louie BostonAPPER,DAVID B, MD   [facility]    Chief Complaint: Left back pain and dizziness.    HPI: Matthew Espinoza is a 76 y.o. male with medical history significant of end-stage renal disease on hemodialysis and atrial fibrillation on warfarin, who [facility], due to left thoracic hematoma. 24 hours ago patient tripped and fell landing on his left side, hitting the ground with his left thorax, developing pain and swelling, required assistance to get back on his feet. Over the course of the following hours he developed a mass on his left posterior thorax which was worsening, firm, no improving or worsening factors, moderately tender, associated with dizziness and lightheadedness. EMS was called and he was found hypotensive. [facility], where no hemodialysis is available.   ED Course: Patient was found hypotensive blood pressure systolic 70's, large deformity on the left posterior thorax, INR up to 3.8 Hb at 9.1Received 500 ml bolus and referred for admission. [facility] the [facility].   Review of Systems:  1. General. Positive for dizziness and lightheadedness. 2. ENT no runny nose or sore throat 3. Pulmonary no shortness of breath, cough or hemoptysis 4. Cardiovascular, no palpitations, no PND orthopnea 5. Gastrointestinal no nausea, vomiting or diarrhea 6. Musculoskeletal positive pain on his left posterior thorax 7. Hematology no easy bruisability or frequent infections 8. Urology no dysuria or increased urinary frequency 9. Dermatology positive for ecchymosis left thoracic region 10. Neurology no seizures or paresthesias  Past Medical History:  Diagnosis Date  . Anemia    chronic  . CAD (coronary artery disease)    a. abnormal perfusion imaging study with multipe, large fixed  defects, EF39% b. EF  50-55% with apical akinesis by 2D echo. Status post coronary intervention with drug-eluting stent placement x3 October 2010 EF 40-45%   . Diabetes mellitus   . Hemodialysis adequacy testing (HCC)    through a Vas-cath catheer  . HTN (hypertension)   . Hypothyroidism   . Paroxysmal atrial fibrillation (HCC)   . Renal failure    chronic, on hemodialysis  . Syncope    s/p ILR no recurrent syncope and negative for arrhythmias last interrogation December 2012  . Ventricular ectopy    History of nonsustained ventricular tachycardia., Status post EP study negative for inducible ventricular tachycardia    Past Surgical History:  Procedure Laterality Date  . cardiac stents    . CATARACT EXTRACTION    . CHOLECYSTECTOMY    . implantable loop recorder    . PERIPHERAL VASCULAR CATHETERIZATION N/A 02/20/2015   Procedure: Abdominal Aortogram;  Surgeon: Fransisco HertzBrian L Chen, MD;  Location: [facility] INVASIVE CV LAB CUPID;  Service: Cardiovascular;  Laterality: N/A;     reports that he quit smoking about 30 years ago. His smoking use included Cigars. He started smoking about 50 years ago. He has a 40.00 pack-year smoking history. He has never used smokeless tobacco. He reports that he does not drink alcohol or use drugs.  No Known Allergies  Family History  Problem Relation Age of Onset  . Diabetes Mother   . Diabetes Father   . Diabetes Other   . Hypertension Other   . Diabetes Sister   . Hypertension Sister   . Cancer Brother   . Varicose Veins Daughter      Prior  to Admission medications   Medication Sig Start Date End Date Taking? Authorizing Provider  Calcium Acetate 667 MG TABS Take 1 tablet by mouth daily.     Historical Provider, MD  folic acid-vitamin b complex-vitamin c-selenium-zinc (DIALYVITE) 3 MG TABS Take 1 tablet by mouth daily.      Historical Provider, MD  levothyroxine (SYNTHROID, LEVOTHROID) 150 MCG tablet Take 150 mcg by mouth daily.  09/11/15   Historical Provider, MD  LIPITOR 80  MG tablet TAKE 1 BY MOUTH EVERY      EVENING 04/17/16   Antoine Poche, MD  NOVOLIN N 100 UNIT/ML injection Inject 35 Units into the skin daily.  07/19/13   Historical Provider, MD  omeprazole (PRILOSEC) 20 MG capsule Take 20 mg by mouth daily.  10/01/13   Historical Provider, MD  polyethylene glycol powder (MIRALAX) powder Take 17 g by mouth as needed for mild constipation.     Historical Provider, MD  RENVELA 800 MG tablet Take 3,200 mg by mouth 2 (two) times daily.  12/05/14   Historical Provider, MD  SENSIPAR 30 MG tablet Take 30 mg by mouth daily.  02/05/13   Historical Provider, MD  TOPROL XL 25 MG 24 hr tablet TAKE 1/2 BY MOUTH DAILY    *DOSE DECREASE* 03/28/16   Antoine Poche, MD  warfarin (COUMADIN) 2 MG tablet TAKE 1/2 TO 1 TABLET BY MOUTH DAILY ALTERNATING BETWEEN 1MG  AND 2MG  DAILY 11/21/16   Jonelle Sidle, MD    Physical Exam: Vitals:   01/10/17 1045  BP: (!) 95/58  Pulse: (!) 120  Resp: 19  Temp: 98.2 F (36.8 C)  TempSrc: Oral  SpO2: 99%    Constitutional: not in pain or dyspnea Vitals:   01/10/17 1045  BP: (!) 95/58  Pulse: (!) 120  Resp: 19  Temp: 98.2 F (36.8 C)  TempSrc: Oral  SpO2: 99%   Eyes: PERRL, lids and conjunctivae with pallor but no icterus.  ENMT: Mucous membranes are moist. Posterior pharynx clear of any exudate or lesions.Normal dentition.  Neck: normal, supple, no masses, no thyromegaly Respiratory: clear to auscultation bilaterally, no wheezing, no crackles. Normal respiratory effort. No accessory muscle use.  Cardiovascular: S-S2 irregulary irregular. No murmurs / rubs / gallops. No extremity edema. 2+ pedal pulses. No carotid bruits.  Abdomen: no tenderness, no masses palpated. No hepatosplenomegaly. Bowel sounds positive.  Musculoskeletal: no clubbing / cyanosis. No joint deformity upper and lower extremities. Good ROM, no contractures. Normal muscle tone. Significant deformity on the left upper back, with linear ecchymosis at the mid left  thoracic region, firm to palpation, non tender.   Skin: no rashes, lesions, ulcers. No induration Neurologic: CN 2-12 grossly intact. Sensation intact, DTR normal. Strength 5/5 in all 4.      Labs on Admission: I have personally reviewed following labs and imaging studies  CBC: No results for input(s): WBC, NEUTROABS, HGB, HCT, MCV, PLT in the last 168 hours. Basic Metabolic Panel: No results for input(s): NA, K, CL, CO2, GLUCOSE, BUN, CREATININE, CALCIUM, MG, PHOS in the last 168 hours. GFR: CrCl cannot be calculated (Patient's most recent lab result is older than the maximum 21 days allowed.). Liver Function Tests: No results for input(s): AST, ALT, ALKPHOS, BILITOT, PROT, ALBUMIN in the last 168 hours. No results for input(s): LIPASE, AMYLASE in the last 168 hours. No results for input(s): AMMONIA in the last 168 hours. Coagulation Profile:  Recent Labs Lab 01/09/17 1129 01/09/17 1130  INR 3.9 3.9  Cardiac Enzymes: No results for input(s): CKTOTAL, CKMB, CKMBINDEX, TROPONINI in the last 168 hours. BNP (last 3 results) No results for input(s): PROBNP in the last 8760 hours. HbA1C: No results for input(s): HGBA1C in the last 72 hours. CBG: No results for input(s): GLUCAP in the last 168 hours. Lipid Profile: No results for input(s): CHOL, HDL, LDLCALC, TRIG, CHOLHDL, LDLDIRECT in the last 72 hours. Thyroid Function Tests: No results for input(s): TSH, T4TOTAL, FREET4, T3FREE, THYROIDAB in the last 72 hours. Anemia Panel: No results for input(s): VITAMINB12, FOLATE, FERRITIN, TIBC, IRON, RETICCTPCT in the last 72 hours. Urine analysis: No results found for: COLORURINE, APPEARANCEUR, LABSPEC, PHURINE, GLUCOSEU, HGBUR, BILIRUBINUR, KETONESUR, PROTEINUR, UROBILINOGEN, NITRITE, LEUKOCYTESUR  Radiological Exams on Admission: No results found.  EKG: Independently reviewed.   Assessment/Plan Active Problems:   Coagulopathy [facility])  This is a 76 year old male who presented  to hospital referred from [facility] emergency department, patient has been diagnosed with a left thoracic hematoma 16109 cm, as a consequence of a trauma. Initially he was hypotensive, he has received 500 mL bolus with improvement of his symptoms. On initial evaluation he was not significantly anemic. His INR is supratherapeutic. Physical examination blood pressure is 90/58, temperature 98.2, heart rate 120, respiratory 19, oxygen saturation 99%.  Positive pallor, oral mucosae is moist, his lungs were clear to auscultation bilaterally, heart S1-S2 present irregular, he does have a significant deformity on his left posterior hemithorax. The pertinent blood work is pending, signout from outside hospital hemoglobin is 9.1 with INR of 3.8.  Working diagnosis. Large left hemithorax hematoma due to vitamin K antagonist coagulopathy, complicated by hypotension  1. Left hemithorax hematoma. Patient will be admitted to the medical unit with a remote telemetry monitor, will check stat complete blood count, follow-up INR. Patient will need imaging in 48 hours to document improvement of the hematoma. Hold warfarin for now.  2. Hypotension. Patient reports having chronic low blood pressure, systolic in 100s at baseline. He does not take daily his antihypertensives due to risk of hypotension. Continue observation, will rebolus if needed, will keep map between 60 and 65. If persistent hypotension and will proceed with echocardiography. In 2016 his systolic LV function was normal. (Old records personally reviewed)  3. Atrial fibrillation. Will check electrocardiogram, continue telemetry monitoring, will continue metoprolol as tolerated. Holding anticoagulation.   4. End-stage renal disease on hemodialysis. Will check a complete chemistry panel now, will consult nephrology for routine hemodialysis. Currently no signs of hypervolemia. Continue Renvela, Sensipar and calcium acetate.  5. Type 2 diabetes mellitus.  Will continue insulin regimen with insulin sliding-scale.  6. Hypothyroidism. Continue levothyroxine 150 mg daily    DVT prophylaxis: none, coagulopathy Code Status: full  Family Communication: No family at the bedside  Disposition Plan: Home  Consults called: Nephrology  (with names) Admission status:  Inpatient   Mauricio Annett Gula MD [facility] Pager [phone]  If 7PM-7AM, please contact night-coverage [email or web address] Password [email or web address]  01/10/2017, 11:08 AM

## 2017-01-10 NOTE — Progress Notes (Signed)
Patient's BP 68/35 HR 81,patient c/o feeling dizzy,sweaty. Denies any chest pain.Patient alert and oriented x4.MD Robb Matarrtiz  notified. Rapid response RN notified. Will continue to monitor. Natacia Chaisson, Drinda Buttsharito Joselita, RCharity fundraiser

## 2017-01-10 NOTE — Progress Notes (Signed)
Night floor coverage note.  The patient was seen earlier because of hypotension and dizziness.  BP was 68/35 mmHg, HR 81, RR 18. The patient denied LOC, chest pain, dyspnea, palpitations, nausea or emesis. He complained of developing dizziness while he was trying to get out of bed to go to the bathroom. Associated symptoms were blurred vision and diaphoresis. EPIC and outiside records reviewed.He received a normal saline bolus of 500 mL. His blood pressure was 89/44 mmHg and his symptoms were significantly improved after infusion of bolus. The patient is normotensive now.  Lungs: CTA bilaterally. Chest: Left upper back hematoma. Cardiovascular: S1-S2, irregularly irregular, no lower extremities edema. Abdomen: Soft, nontender.  EKG Vent. rate 102 BPM PR interval * ms QRS duration 90 ms QT/QTc 328/427 ms P-R-T axes * 76 149 Atrial fibrillation with rapid ventricular response Possible Inferior infarct , age undetermined Abnormal ECG  Troponin I [[id number]][[id number]] (Abnormal) Collected: 01/10/17 2215  Updated: 01/10/17 2315   Specimen Type: Blood    Troponin I 0.07 (HH) ng/mL  Hemoglobin and hematocrit, blood [[id number]][[id number]] (Abnormal) Collected: 01/10/17 2137  Updated: 01/10/17 2234   Specimen Type: Blood    Hemoglobin 7.8 (L) g/dL   HCT 78.224.6 (L) %  ABO/Rh [[id number]][[id number]] Collected: 01/10/17 2142  Updated: 01/10/17 2228    ABO/RH(D) A POS  Type and screen [facility] [[id number]][[id number]] Collected: 01/10/17 2142  Updated: 01/10/17 2227   Specimen Type: Blood   Specimen Source: Vein    ABO/RH(D) A POS   Antibody Screen NEG    1) Near-syncope  Secondary to hypotension secondary to blood loss and postural changes. Will continue to trend hematocrit, hemoglobin and troponin levels. Ordered type and screen. Will transfer to the stepdown unit for closer monitoring. Would like to thank Ms. Victorino DikeLeslie Shular from [employer] for her assistance.   Sanda Kleinavid Ortiz, MD [phone].

## 2017-01-10 NOTE — Progress Notes (Signed)
Matthew Espinoza [phone] (H) [phone] (C)  Please call with any updates

## 2017-01-10 NOTE — Significant Event (Signed)
Rapid Response Event Note RN called for pt hypotensive, diaphoretic, and light headed Overview: Time Called: 2053 Arrival Time: 2058 Event Type: Hypotension  Initial Focused Assessment: On arrival pt lying supine in the bed, cool rag placed to forehead. Pt alert and oriented x4, pale in color, skin warm and dry. Pt reports he became "hot all over, sweaty, and swimmy headed" as he attempted to pull himself up from the bed to use the restroom. RN founf BP to 83/48 with a repeat of 68/35.   Interventions: Dr. Robb Matarrtiz paged PTA, orders for 500 cc NS bolus given and [facility]. BP 89/44, HR 97, RR 18, 96% RA after 500 cc NS bolus completed. H&H and Type and screen collected, Hgb 7.8. Pt to be transferred to SDU 4N10   Event Summary: Name of Physician Notified: Dr. Robb Matarrtiz at  (PTA RRT )    at        [facility], Sandi CarneLESLIE Paige

## 2017-01-10 NOTE — Progress Notes (Addendum)
New Admission Note:   [facility]  Mental Orientation: alert and oriented x4  Telemetry: n/a Assessment: Completed Skin: Abrasion on left leg and left arm applied foam. On back left side - large hematoma.  XB:JYNWV:left AC  Pain: pain in back left side (hematoma). No orders at the time.  Tubes: n/a  Safety Measures: Safety Fall Prevention Plan has been discussed  Admission: pending  [facility] Orientation: Patient has been orientated to the room, unit and staff.  Family: n/a    Informed MD Arrien of HR 120 and BP 95/58.    Awaiting orders. Will continue to monitor the patient. Call light has been placed within reach and bed alarm has been activated.   Nelma RothmanNatalie Vue Pavon, RN [facility]  Phone number: [phone]

## 2017-01-10 NOTE — Progress Notes (Signed)
CRITICAL VALUE ALERT  Critical value received: Troponin=0.07  Date of notification:  01/11/16  Time of notification:  23:14  Critical value read back:Yes.    Nurse who received alert:  Kermit Baloharito Danzig Macgregor  MD notified (1st page):  Ortiz,MD  Time of first page:  23:24  MD notified (2nd page):  Time of second page:  Responding MD:  Tiana Loftrtiz,MD  Time MD responded:  23:25

## 2017-01-11 DIAGNOSIS — S301XXA Contusion of abdominal wall, initial encounter: Secondary | ICD-10-CM

## 2017-01-11 LAB — RENAL FUNCTION PANEL
Albumin: 2.8 g/dL — ABNORMAL LOW (ref 3.5–5.0)
Anion gap: 11 (ref 5–15)
BUN: 26 mg/dL — ABNORMAL HIGH (ref 6–20)
CO2: 30 mmol/L (ref 22–32)
Calcium: 9.6 mg/dL (ref 8.9–10.3)
Chloride: 97 mmol/L — ABNORMAL LOW (ref 101–111)
Creatinine, Ser: 5.74 mg/dL — ABNORMAL HIGH (ref 0.61–1.24)
GFR calc Af Amer: 10 mL/min — ABNORMAL LOW (ref >60)
GFR calc non Af Amer: 9 mL/min — ABNORMAL LOW (ref >60)
Glucose, Bld: 264 mg/dL — ABNORMAL HIGH (ref 65–99)
Phosphorus: 4.2 mg/dL (ref 2.5–4.6)
Potassium: 3.9 mmol/L (ref 3.5–5.1)
Sodium: 138 mmol/L (ref 135–145)

## 2017-01-11 LAB — COMPREHENSIVE METABOLIC PANEL
ALBUMIN: 2.8 g/dL — AB (ref 3.5–5.0)
ALT: 23 U/L (ref 17–63)
ANION GAP: 12 (ref 5–15)
AST: 26 U/L (ref 15–41)
Alkaline Phosphatase: 105 U/L (ref 38–126)
BILIRUBIN TOTAL: 1.3 mg/dL — AB (ref 0.3–1.2)
BUN: 27 mg/dL — ABNORMAL HIGH (ref 6–20)
CO2: 29 mmol/L (ref 22–32)
Calcium: 9.6 mg/dL (ref 8.9–10.3)
Chloride: 98 mmol/L — ABNORMAL LOW (ref 101–111)
Creatinine, Ser: 5.8 mg/dL — ABNORMAL HIGH (ref 0.61–1.24)
GFR calc Af Amer: 10 mL/min — ABNORMAL LOW (ref >60)
GFR calc non Af Amer: 8 mL/min — ABNORMAL LOW (ref >60)
GLUCOSE: 259 mg/dL — AB (ref 65–99)
POTASSIUM: 3.9 mmol/L (ref 3.5–5.1)
SODIUM: 139 mmol/L (ref 135–145)
TOTAL PROTEIN: 6 g/dL — AB (ref 6.5–8.1)

## 2017-01-11 LAB — GLUCOSE, CAPILLARY
GLUCOSE-CAPILLARY: 194 mg/dL — AB (ref 65–99)
GLUCOSE-CAPILLARY: 254 mg/dL — AB (ref 65–99)
Glucose-Capillary: 203 mg/dL — ABNORMAL HIGH (ref 65–99)

## 2017-01-11 LAB — IRON AND TIBC
IRON: 75 ug/dL (ref 45–182)
Saturation Ratios: 30 % (ref 17.9–39.5)
TIBC: 248 ug/dL — ABNORMAL LOW (ref 250–450)
UIBC: 173 ug/dL

## 2017-01-11 LAB — CBC
HEMATOCRIT: 25 % — AB (ref 39.0–52.0)
HEMOGLOBIN: 8 g/dL — AB (ref 13.0–17.0)
MCH: 30 pg (ref 26.0–34.0)
MCHC: 32 g/dL (ref 30.0–36.0)
MCV: 93.6 fL (ref 78.0–100.0)
Platelets: 141 10*3/uL — ABNORMAL LOW (ref 150–400)
RBC: 2.67 MIL/uL — AB (ref 4.22–5.81)
RDW: 14.7 % (ref 11.5–15.5)
WBC: 12.2 10*3/uL — ABNORMAL HIGH (ref 4.0–10.5)

## 2017-01-11 LAB — MRSA PCR SCREENING: MRSA by PCR: NEGATIVE

## 2017-01-11 LAB — PROTIME-INR
INR: 2.41
Prothrombin Time: 26.7 seconds — ABNORMAL HIGH (ref 11.4–15.2)

## 2017-01-11 LAB — TROPONIN I: TROPONIN I: 0.09 ng/mL — AB (ref <0.03)

## 2017-01-11 MED ORDER — DARBEPOETIN ALFA 25 MCG/0.42ML IJ SOSY
PREFILLED_SYRINGE | INTRAMUSCULAR | Status: AC
  Administered 2017-01-11: 25 ug via INTRAVENOUS
  Filled 2017-01-11: qty 0.42

## 2017-01-11 MED ORDER — DOXERCALCIFEROL 4 MCG/2ML IV SOLN
INTRAVENOUS | Status: AC
  Administered 2017-01-11: 2 ug via INTRAVENOUS
  Filled 2017-01-11: qty 2

## 2017-01-11 MED ORDER — CALCIUM ACETATE (PHOS BINDER) 667 MG PO CAPS
667.0000 mg | ORAL_CAPSULE | Freq: Every day | ORAL | Status: DC
  Administered 2017-01-11 – 2017-01-12 (×2): 667 mg via ORAL
  Filled 2017-01-11 (×2): qty 1

## 2017-01-11 NOTE — Progress Notes (Signed)
Pt admitted into 4N10, placed on monitor, axo x 4, denies any discomfort, breathing even and unlabored,VSS, no s/s of distress noted, will continue to monitor pt closely

## 2017-01-11 NOTE — Progress Notes (Signed)
Subjective:  Pt got more hypotensive with sxms so transferred to stepdown overnight- currently sbp 88-110- got 500 cc bolus- hgb pretty stable Objective Vital signs in last 24 hours: Vitals:   01/11/17 0400 01/11/17 0500 01/11/17 0600 01/11/17 0645  BP: 96/61 (!) 91/58 (!) 88/51 110/61  Pulse: 100 96 96 (!) 103  Resp: 18 (!) 21 18 19   Temp:      TempSrc:      SpO2: 100% 96% 96% 95%  Weight: 83.6 kg (184 lb 4.8 oz)     Height: 5\' 11"  (1.803 m)      Weight change:   Intake/Output Summary (Last 24 hours) at 01/11/17 0753 Last data filed at 01/10/17 1814  Gross per 24 hour  Intake              343 ml  Output                0 ml  Net              343 ml    Dialysis Orders: [facility] 2 K 2.5 Ca 450/600 NO HEPARIN 4 hr EDW 84 temp 35  right upper AVF Pre HD BP 3/23 was 150s/80s sitting and standing and 99/55 sitting and 100/86 standing P 86--pre HD weight was 86 with post 84.2 Epogen 1000, Zone Bar, hectorol 2 sensipar 30  Recent labs: hgb 10.3 3/13 Hep B sAG 2/22 neg and HBsAB 3 07/2016  Assessment/Plan: 1. Left hemothroax- secondary to fall with slightly elevated INR- hgb actually stable 2. ESRD -  TTS via AVF - hold heparin -HD today - minimal UF 3. BP/volume  - keep BP >95 - got a little bolus last night- has not required pressors- is under EDW- really minimal UF with HD today 4. Anemia  - hgb 9.1 per admitting note - last outpatient Hgb was 10.3 on 3/13- for recheck hgb lower but stable around 8 5. Metabolic bone disease -  On 4 renvela with meals, hectorol  2 and sensipar 30 6. Nutrition - renal carb mod/vit 7. DM- per primary BS elevated 8. AFib - INR 3.9 - now 2.4 on MTP 1/2 25 mg per day but doesn't take regularly- not sure what baseline HR is though BP was 118/65 and P 85 at 3/2 OV with cardiology   Dameisha Tschida A    Labs: Basic Metabolic Panel:  Recent Labs Lab 01/10/17 1651 01/11/17 0243  NA 137 139  138  K 3.8 3.9  3.9  CL 96* 98*  97*  CO2 28 29   30   GLUCOSE 320* 259*  264*  BUN 23* 27*  26*  CREATININE 5.08* 5.80*  5.74*  CALCIUM 9.4 9.6  9.6  PHOS  --  4.2   Liver Function Tests:  Recent Labs Lab 01/11/17 0243  AST 26  ALT 23  ALKPHOS 105  BILITOT 1.3*  PROT 6.0*  ALBUMIN 2.8*  2.8*   No results for input(s): LIPASE, AMYLASE in the last 168 hours. No results for input(s): AMMONIA in the last 168 hours. CBC:  Recent Labs Lab 01/10/17 1651 01/10/17 2137 01/11/17 0243  WBC 11.0*  --  12.2*  NEUTROABS 8.1*  --   --   HGB 8.3* 7.8* 8.0*  HCT 25.8* 24.6* 25.0*  MCV 93.8  --  93.6  PLT 141*  --  141*   Cardiac Enzymes:  Recent Labs Lab 01/10/17 2215 01/11/17 0243  TROPONINI 0.07* 0.09*   CBG:  Recent Labs Lab  01/10/17 1117 01/10/17 1621 01/10/17 2020  GLUCAP 300* 327* 226*    Iron Studies: No results for input(s): IRON, TIBC, TRANSFERRIN, FERRITIN in the last 72 hours. Studies/Results: No results found. Medications: Infusions:   Scheduled Medications: . atorvastatin  80 mg Oral q1800  . calcium acetate  667 mg Oral Q breakfast  . cinacalcet  30 mg Oral Q breakfast  . darbepoetin (ARANESP) injection - DIALYSIS  25 mcg Intravenous Q Sat-HD  . doxercalciferol  2 mcg Intravenous Q T,Th,Sa-HD  . insulin aspart  0-9 Units Subcutaneous TID WC  . insulin NPH Human  15 Units Subcutaneous Q breakfast  . levothyroxine  150 mcg Oral QAC breakfast  . multivitamin  1 tablet Oral QHS  . pantoprazole  40 mg Oral Daily  . sevelamer carbonate  3,200 mg Oral BID WC  . sodium chloride flush  3 mL Intravenous Q12H  . sodium chloride flush  3 mL Intravenous Q12H    have reviewed scheduled and prn medications.  Physical Exam: General: NAD- no further episodes of dizziness but also has not tried to get up again Heart: slightly tachy Lungs: moslty clear Abdomen: soft, non tender Extremities: no edema Dialysis Access: right upper arm AVF     01/11/2017,7:53 AM  LOS: 1 day

## 2017-01-11 NOTE — Progress Notes (Signed)
[facility]

## 2017-01-11 NOTE — Progress Notes (Signed)
PROGRESS NOTE    Matthew Espinoza  WUJ:[id number] DOB: 31-Oct-1940 DOA: 01/10/2017 PCP: Louie Boston, MD     Brief Narrative:  Matthew Espinoza is a 76 y.o. male with medical history significant of end-stage renal disease on hemodialysis and atrial fibrillation on warfarin, who presents from [facility], due to left thoracic hematoma. Patient tripped and fell outside when he missed a step, landing on his left side, hitting the ground with his left thorax, developing pain and swelling, required assistance to get back on his feet. Over the course of the following hours he developed a mass on his left posterior thorax which was worsening, firm, no improving or worsening factors, moderately tender, associated with dizziness and lightheadedness. EMS was called and he was found hypotensive. He was taken to the emergency room at [facility], where no hemodialysis is available. Patient was admitted due to left flank/hemithorax hematoma, hypotension.   Assessment & Plan:   Active Problems:   Coagulopathy (HCC)  Left hemithorax hematoma -In setting of fall while taking coumadin -Outlined bruising along the left flank is improved in appearance today -Trend CBC, Hgb has been stable at 8   Hypotension -Patient reports having chronic low blood pressure, systolic in 100s at baseline. He does not take daily his antihypertensives due to risk of hypotension -Transferred to SDU overnight due to hypotension, currently stable today   Chronic atrial fibrillation -CHADSVASc -Continue metoprolol as tolerated. Holding anticoagulation.   End-stage renal disease on hemodialysis -Nephrology  Type 2 diabetes mellitus -SSI  Hypothyroidism -Continue levothyroxine 150 mg daily  HLD -Continue lipitor   GERD -PPI    DVT prophylaxis: Holding Coumadin for now Code Status: Full code Family Communication: No family at bedside Disposition Plan: Pending improvement   Consultants:    Nephrology  Procedures:   None  Antimicrobials:   None    Subjective: Patient without any complaints this morning. He denies any dizziness or lightheadedness, no chest pain or shortness of breath. He had an episode of hypotension overnight and was transferred to stepdown unit. Currently feeling well without any issues.  Objective: Vitals:   01/11/17 0600 01/11/17 0645 01/11/17 0758 01/11/17 1306  BP: (!) 88/51 110/61 105/61 115/68  Pulse: 96 (!) 103 90 99  Resp: 18 19 (!) 21 16  Temp:   98.2 F (36.8 C) 98.4 F (36.9 C)  TempSrc:   Oral Oral  SpO2: 96% 95% 100% 97%  Weight:      Height:        Intake/Output Summary (Last 24 hours) at 01/11/17 1330 Last data filed at 01/10/17 1814  Gross per 24 hour  Intake              103 ml  Output                0 ml  Net              103 ml   Filed Weights   01/11/17 0400  Weight: 83.6 kg (184 lb 4.8 oz)    Examination:  General exam: Appears calm and comfortable  Respiratory system: Clear to auscultation. Respiratory effort normal. Cardiovascular system: S1 & S2 heard, Irregular rhythm. No JVD, murmurs, rubs, gallops or clicks. No pedal edema. Gastrointestinal system: Abdomen is nondistended, soft and nontender. No organomegaly or masses felt. Normal bowel sounds heard. Central nervous system: Alert and oriented. No focal neurological deficits. Extremities: Symmetric 5 x 5 power. Skin: No rashes, lesions or ulcers, left flank is  outlined with a marker. Bruising improved Psychiatry: Judgement and insight appear normal. Mood & affect appropriate.   Data Reviewed: I have personally reviewed following labs and imaging studies  CBC:  Recent Labs Lab 01/10/17 1651 01/10/17 2137 01/11/17 0243  WBC 11.0*  --  12.2*  NEUTROABS 8.1*  --   --   HGB 8.3* 7.8* 8.0*  HCT 25.8* 24.6* 25.0*  MCV 93.8  --  93.6  PLT 141*  --  141*   Basic Metabolic Panel:  Recent Labs Lab 01/10/17 1651 01/11/17 0243  NA 137 139  138   K 3.8 3.9  3.9  CL 96* 98*  97*  CO2 GLUCOSE 320* 259*  264*  BUN 23* 27*  26*  CREATININE 5.08* 5.80*  5.74*  CALCIUM 9.4 9.6  9.6  PHOS  --  4.2   GFR: Estimated Creatinine Clearance: 11.5 mL/min (A) (by C-G formula based on SCr of 5.8 mg/dL (H)). Liver Function Tests:  Recent Labs Lab 01/11/17 0243  AST 26  ALT 23  ALKPHOS 105  BILITOT 1.3*  PROT 6.0*  ALBUMIN 2.8*  2.8*   No results for input(s): LIPASE, AMYLASE in the last 168 hours. No results for input(s): AMMONIA in the last 168 hours. Coagulation Profile:  Recent Labs Lab 01/09/17 1129 01/09/17 1130 01/10/17 1651 01/11/17 0243  INR 3.9 3.9 2.62 2.41   Cardiac Enzymes:  Recent Labs Lab 01/10/17 2215 01/11/17 0243  TROPONINI 0.07* 0.09*   BNP (last 3 results) No results for input(s): PROBNP in the last 8760 hours. HbA1C: No results for input(s): HGBA1C in the last 72 hours. CBG:  Recent Labs Lab 01/10/17 1117 01/10/17 1621 01/10/17 2020 01/11/17 0802 01/11/17 1153  GLUCAP 300* 327* 226* 194* 254*   Lipid Profile: No results for input(s): CHOL, HDL, LDLCALC, TRIG, CHOLHDL, LDLDIRECT in the last 72 hours. Thyroid Function Tests: No results for input(s): TSH, T4TOTAL, FREET4, T3FREE, THYROIDAB in the last 72 hours. Anemia Panel: No results for input(s): VITAMINB12, FOLATE, FERRITIN, TIBC, IRON, RETICCTPCT in the last 72 hours. Sepsis Labs: No results for input(s): PROCALCITON, LATICACIDVEN in the last 168 hours.  Recent Results (from the past 240 hour(s))  MRSA PCR Screening     Status: None   Collection Time: 01/10/17 11:15 PM  Result Value Ref Range Status   MRSA by PCR NEGATIVE NEGATIVE Final    Comment:        The GeneXpert MRSA Assay (FDA approved for NASAL specimens only), is one component of a comprehensive MRSA colonization surveillance program. It is not intended to diagnose MRSA infection nor to guide or monitor treatment for MRSA infections.         Radiology Studies: No results found.    Scheduled Meds: . atorvastatin  80 mg Oral q1800  . calcium acetate  667 mg Oral Q breakfast  . cinacalcet  30 mg Oral Q breakfast  . darbepoetin (ARANESP) injection - DIALYSIS  25 mcg Intravenous Q Sat-HD  . doxercalciferol  2 mcg Intravenous Q T,Th,Sa-HD  . insulin aspart  0-9 Units Subcutaneous TID WC  . insulin NPH Human  15 Units Subcutaneous Q breakfast  . levothyroxine  150 mcg Oral QAC breakfast  . multivitamin  1 tablet Oral QHS  . pantoprazole  40 mg Oral Daily  . sevelamer carbonate  3,200 mg Oral BID WC  . sodium chloride flush  3 mL Intravenous Q12H  . sodium chloride flush  3 mL Intravenous Q12H  Continuous Infusions:   LOS: 1 day    Time spent: 40 minutes   Noralee Stain, DO [facility] [email or web address] Password [email or web address] 01/11/2017, 1:30 PM

## 2017-01-12 ENCOUNTER — Inpatient Hospital Stay (HOSPITAL_COMMUNITY): Payer: Medicare Other

## 2017-01-12 LAB — BASIC METABOLIC PANEL
Anion gap: 8 (ref 5–15)
BUN: 13 mg/dL (ref 6–20)
CALCIUM: 8.9 mg/dL (ref 8.9–10.3)
CHLORIDE: 98 mmol/L — AB (ref 101–111)
CO2: 30 mmol/L (ref 22–32)
CREATININE: 3.76 mg/dL — AB (ref 0.61–1.24)
GFR calc non Af Amer: 14 mL/min — ABNORMAL LOW (ref >60)
GFR, EST AFRICAN AMERICAN: 17 mL/min — AB (ref >60)
Glucose, Bld: 245 mg/dL — ABNORMAL HIGH (ref 65–99)
Potassium: 4.2 mmol/L (ref 3.5–5.1)
SODIUM: 136 mmol/L (ref 135–145)

## 2017-01-12 LAB — CBC
HCT: 22.3 % — ABNORMAL LOW (ref 39.0–52.0)
HEMATOCRIT: 26.7 % — AB (ref 39.0–52.0)
HEMOGLOBIN: 7.2 g/dL — AB (ref 13.0–17.0)
Hemoglobin: 8.9 g/dL — ABNORMAL LOW (ref 13.0–17.0)
MCH: 30.5 pg (ref 26.0–34.0)
MCH: 30.9 pg (ref 26.0–34.0)
MCHC: 32.3 g/dL (ref 30.0–36.0)
MCHC: 33.3 g/dL (ref 30.0–36.0)
MCV: 94.5 fL (ref 78.0–100.0)
MCV: 92.7 fL (ref 78.0–100.0)
PLATELETS: 130 10*3/uL — AB (ref 150–400)
Platelets: 135 10*3/uL — ABNORMAL LOW (ref 150–400)
RBC: 2.36 MIL/uL — ABNORMAL LOW (ref 4.22–5.81)
RBC: 2.88 MIL/uL — AB (ref 4.22–5.81)
RDW: 15.2 % (ref 11.5–15.5)
RDW: 14.7 % (ref 11.5–15.5)
WBC: 9.6 10*3/uL (ref 4.0–10.5)
WBC: 11.2 10*3/uL — AB (ref 4.0–10.5)

## 2017-01-12 LAB — PROTIME-INR
INR: 2.15
PROTHROMBIN TIME: 24.4 s — AB (ref 11.4–15.2)

## 2017-01-12 LAB — PREPARE RBC (CROSSMATCH)

## 2017-01-12 LAB — GLUCOSE, CAPILLARY
Glucose-Capillary: 230 mg/dL — ABNORMAL HIGH (ref 65–99)
Glucose-Capillary: 236 mg/dL — ABNORMAL HIGH (ref 65–99)
Glucose-Capillary: 227 mg/dL — ABNORMAL HIGH (ref 65–99)
Glucose-Capillary: 166 mg/dL — ABNORMAL HIGH (ref 65–99)

## 2017-01-12 LAB — HEPATITIS B SURFACE ANTIGEN: HEP B S AG: NEGATIVE

## 2017-01-12 MED ORDER — DARBEPOETIN ALFA 100 MCG/0.5ML IJ SOSY: 100.0000 ug | PREFILLED_SYRINGE | INTRAMUSCULAR | Status: DC

## 2017-01-12 MED ORDER — SODIUM CHLORIDE 0.9 % IV SOLN
Freq: Once | INTRAVENOUS | Status: AC
  Administered 2017-01-12: 11:00:00 via INTRAVENOUS

## 2017-01-12 NOTE — Progress Notes (Signed)
Md paged for pt's hgb 7.2 this am.  No signs of bleeding noted.  Will continue to monitor Matthew Espinoza, Matthew Espinoza

## 2017-01-12 NOTE — Progress Notes (Signed)
Subjective:  Had HD yesterday - removed 440 ccs- BP went into 80's but dont think that is unusual for pt in OP setting- BP 97 to 100 this AM- no c/o's- wants to go home  Objective Vital signs in last 24 hours: Vitals:   01/11/17 1900 01/11/17 2007 01/11/17 2324 01/12/17 0327  BP: 109/60 109/65 97/61 100/60  Pulse: (!) 103 (!) 56 100 99  Resp:  16 19 (!) 23  Temp:  98.7 F (37.1 C) 99.4 F (37.4 C) 98 F (36.7 C)  TempSrc: Oral Oral Oral Oral  SpO2: 100% 100% 96% 93%  Weight:      Height:       Weight change: 0.002 kg (0.1 oz)  Intake/Output Summary (Last 24 hours) at 01/12/17 0725 Last data filed at 01/11/17 2100  Gross per 24 hour  Intake              240 ml  Output              440 ml  Net             -200 ml    Dialysis Orders: [facility] 2 K 2.5 Ca 450/600 NO HEPARIN 4 hr EDW 84 temp 35  right upper AVF Pre HD BP 3/23 was 150s/80s sitting and standing and 99/55 sitting and 100/86 standing P 86--pre HD weight was 86 with post 84.2 Epogen 1000, Zone Bar, hectorol 2 sensipar 30  Recent labs: hgb 10.3 3/13 Hep B sAG 2/22 neg and HBsAB 3 07/2016  Assessment/Plan: 1. Left hemothroax- secondary to fall with slightly elevated INR- hgb down a little more today 2. ESRD -  TTS via AVF - hold heparin -keep on schedule - minimal UF 3. BP/volume  - keep BP >95 - got a little bolus last night- has not required pressors- is under EDW- really minimal UF with HD Saturday 4. Anemia  - hgb 9.1 per admitting note - last outpatient Hgb was 10.3 on 3/13- for recheck hgb lower and dec to 7.2 today- given only 25 of aranesp Sat- will increase for next dose- may need transfusion especially if wants to go home today 5. Metabolic bone disease -  On 4 renvela with meals and phoslo , hectorol  2 and sensipar 30- calc and phos OK 6. Nutrition - renal carb mod/vit 7. DM- per primary BS elevated 8. AFib - INR 3.9 - now 2.1 on MTP 1/2 25 mg per day but doesn't take regularly-  118/65 and P 85 at 3/2 OV  with cardiology 9. Dispo- he wants to go home- not sure great idea with hgb down- if d/c is desired would transfuse one unit to give more of a cushion but hgb can be monitored as OP in dialysis but cannot transfuse as OP   Matthew Espinoza A    Labs: Basic Metabolic Panel:  Recent Labs Lab 01/10/17 1651 01/11/17 0243 01/12/17 0317  NA 137 139  138 136  K 3.8 3.9  3.9 4.2  CL 96* 98*  97* 98*  CO2 28 29  30 30   GLUCOSE 320* 259*  264* 245*  BUN 23* 27*  26* 13  CREATININE 5.08* 5.80*  5.74* 3.76*  CALCIUM 9.4 9.6  9.6 8.9  PHOS  --  4.2  --    Liver Function Tests:  Recent Labs Lab 01/11/17 0243  AST 26  ALT 23  ALKPHOS 105  BILITOT 1.3*  PROT 6.0*  ALBUMIN 2.8*  2.8*   No  results for input(s): LIPASE, AMYLASE in the last 168 hours. No results for input(s): AMMONIA in the last 168 hours. CBC:  Recent Labs Lab 01/10/17 1651 01/10/17 2137 01/11/17 0243 01/12/17 0317  WBC 11.0*  --  12.2* 9.6  NEUTROABS 8.1*  --   --   --   HGB 8.3* 7.8* 8.0* 7.2*  HCT 25.8* 24.6* 25.0* 22.3*  MCV 93.8  --  93.6 94.5  PLT 141*  --  141* 130*   Cardiac Enzymes:  Recent Labs Lab 01/10/17 2215 01/11/17 0243  TROPONINI 0.07* 0.09*   CBG:  Recent Labs Lab 01/10/17 1621 01/10/17 2020 01/11/17 0802 01/11/17 1153 01/11/17 2127  GLUCAP 327* 226* 194* 254* 203*    Iron Studies:   Recent Labs  01/11/17 1925  IRON 75  TIBC 248*   Studies/Results: No results found. Medications: Infusions:   Scheduled Medications: . atorvastatin  80 mg Oral q1800  . calcium acetate  667 mg Oral Q supper  . cinacalcet  30 mg Oral Q breakfast  . darbepoetin (ARANESP) injection - DIALYSIS  25 mcg Intravenous Q Sat-HD  . doxercalciferol  2 mcg Intravenous Q T,Th,Sa-HD  . insulin aspart  0-9 Units Subcutaneous TID WC  . insulin NPH Human  15 Units Subcutaneous Q breakfast  . levothyroxine  150 mcg Oral QAC breakfast  . multivitamin  1 tablet Oral QHS  .  pantoprazole  40 mg Oral Daily  . sevelamer carbonate  3,200 mg Oral BID WC  . sodium chloride flush  3 mL Intravenous Q12H  . sodium chloride flush  3 mL Intravenous Q12H    have reviewed scheduled and prn medications.  Physical Exam: General: NAD- no further episodes of dizziness but also has not tried to get up again Heart: slightly tachy Lungs: moslty clear Abdomen: soft, non tender Extremities: no edema Dialysis Access: right upper arm AVF     01/12/2017,7:25 AM  LOS: 2 days

## 2017-01-12 NOTE — Progress Notes (Signed)
PROGRESS NOTE    Matthew Espinoza  ZOX:[id number] DOB: February 04, 1941 DOA: 01/10/2017 PCP: Louie BostonAPPER,DAVID B, MD     Brief Narrative:  Matthew Espinoza is a 76 y.o. male with medical history significant of end-stage renal disease on hemodialysis and atrial fibrillation on warfarin, who presents from [facility], due to left thoracic hematoma. Patient tripped and fell outside when he missed a step, landing on his left side, hitting the ground with his left thorax, developing pain and swelling, required assistance to get back on his feet. Over the course of the following hours he developed a mass on his left posterior thorax which was worsening, firm, no improving or worsening factors, moderately tender, associated with dizziness and lightheadedness. EMS was called and he was found hypotensive. He was taken to the emergency [facility] at [facility], where no hemodialysis is available. Patient was admitted due to left flank/hemithorax hematoma, hypotension.   Assessment & Plan:   Active Problems:   Coagulopathy (HCC)  Left hemithorax hematoma -In setting of fall while taking coumadin -Outlined bruising along the left flank is worsened in appearance today, Hgb dropped to 7.2. Will transfuse 1u pRBC today, check CT and orthostatics    Hypotension -Patient reports having chronic low blood pressure, systolic in 100s at baseline. He does not take daily his antihypertensives due to risk of hypotension -Transferred to SDU due to hypotension, BP stable today   Chronic atrial fibrillation -CHADSVASc > 3 -Continue metoprolol as tolerated. Holding anticoagulation.   End-stage renal disease on hemodialysis -Nephrology  Type 2 diabetes mellitus -SSI  Hypothyroidism -Continue levothyroxine 150 mg daily  HLD -Continue lipitor   GERD -PPI    DVT prophylaxis: Holding Coumadin for now Code Status: Full code Family Communication: No family at bedside Disposition Plan: Pending improvement, likely  discharge back home    Consultants:   Nephrology  Procedures:   None  Antimicrobials:   None    Subjective: Patient without any complaints this morning. He denies any dizziness or lightheadedness, no chest pain or shortness of breath.   Objective: Vitals:   01/12/17 0752 01/12/17 1045 01/12/17 1120 01/12/17 1159  BP: 106/63 102/70 113/62   Pulse: (!) 102 93 88   Resp: (!) 27 18 17    Temp: 98.3 F (36.8 C) 98.3 F (36.8 C) 98.2 F (36.8 C) 98.2 F (36.8 C)  TempSrc: Oral Oral Oral Oral  SpO2: 99% 100% 100%   Weight:      Height:        Intake/Output Summary (Last 24 hours) at 01/12/17 1225 Last data filed at 01/11/17 2100  Gross per 24 hour  Intake              240 ml  Output              440 ml  Net             -200 ml   Filed Weights   01/11/17 0400 01/11/17 1425 01/11/17 1832  Weight: 83.6 kg (184 lb 4.8 oz) 83.6 kg (184 lb 4.9 oz) 83.2 kg (183 lb 6.8 oz)    Examination:  General exam: Appears calm and comfortable  Respiratory system: Clear to auscultation. Respiratory effort normal. Cardiovascular system: S1 & S2 heard, Irregular rhythm. No JVD, murmurs, rubs, gallops or clicks. No pedal edema. Gastrointestinal system: Abdomen is nondistended, soft and nontender. No organomegaly or masses felt. Normal bowel sounds heard. Central nervous system: Alert and oriented. No focal neurological deficits. Extremities: Symmetric 5  x 5 power. Skin: No rashes, lesions or ulcers, left flank is outlined with a marker. Bruising has extended past the marker lines  Psychiatry: Judgement and insight appear normal. Mood & affect appropriate.   Data Reviewed: I have personally reviewed following labs and imaging studies  CBC:  Recent Labs Lab 01/10/17 1651 01/10/17 2137 01/11/17 0243 01/12/17 0317  WBC 11.0*  --  12.2* 9.6  NEUTROABS 8.1*  --   --   --   HGB 8.3* 7.8* 8.0* 7.2*  HCT 25.8* 24.6* 25.0* 22.3*  MCV 93.8  --  93.6 94.5  PLT 141*  --  141* 130*    Basic Metabolic Panel:  Recent Labs Lab 01/10/17 1651 01/11/17 0243 01/12/17 0317  NA 137 139  138 136  K 3.8 3.9  3.9 4.2  CL 96* 98*  97* 98*  CO2 28 29  30 30   GLUCOSE 320* 259*  264* 245*  BUN 23* 27*  26* 13  CREATININE 5.08* 5.80*  5.74* 3.76*  CALCIUM 9.4 9.6  9.6 8.9  PHOS  --  4.2  --    GFR: Estimated Creatinine Clearance: 17.8 mL/min (A) (by C-G formula based on SCr of 3.76 mg/dL (H)). Liver Function Tests:  Recent Labs Lab 01/11/17 0243  AST 26  ALT 23  ALKPHOS 105  BILITOT 1.3*  PROT 6.0*  ALBUMIN 2.8*  2.8*   No results for input(s): LIPASE, AMYLASE in the last 168 hours. No results for input(s): AMMONIA in the last 168 hours. Coagulation Profile:  Recent Labs Lab 01/09/17 1129 01/09/17 1130 01/10/17 1651 01/11/17 0243 01/12/17 0317  INR 3.9 3.9 2.62 2.41 2.15   Cardiac Enzymes:  Recent Labs Lab 01/10/17 2215 01/11/17 0243  TROPONINI 0.07* 0.09*   BNP (last 3 results) No results for input(s): PROBNP in the last 8760 hours. HbA1C: No results for input(s): HGBA1C in the last 72 hours. CBG:  Recent Labs Lab 01/11/17 0802 01/11/17 1153 01/11/17 2127 01/12/17 0753 01/12/17 1201  GLUCAP 194* 254* 203* 230* 236*   Lipid Profile: No results for input(s): CHOL, HDL, LDLCALC, TRIG, CHOLHDL, LDLDIRECT in the last 72 hours. Thyroid Function Tests: No results for input(s): TSH, T4TOTAL, FREET4, T3FREE, THYROIDAB in the last 72 hours. Anemia Panel:  Recent Labs  01/11/17 1925  TIBC 248*  IRON 75   Sepsis Labs: No results for input(s): PROCALCITON, LATICACIDVEN in the last 168 hours.  Recent Results (from the past 240 hour(s))  MRSA PCR Screening     Status: None   Collection Time: 01/10/17 11:15 PM  Result Value Ref Range Status   MRSA by PCR NEGATIVE NEGATIVE Final    Comment:        The GeneXpert MRSA Assay (FDA approved for NASAL specimens only), is one component of a comprehensive MRSA  colonization surveillance program. It is not intended to diagnose MRSA infection nor to guide or monitor treatment for MRSA infections.        Radiology Studies: No results found.    Scheduled Meds: . atorvastatin  80 mg Oral q1800  . calcium acetate  667 mg Oral Q supper  . cinacalcet  30 mg Oral Q breakfast  . [START ON 01/18/2017] darbepoetin (ARANESP) injection - DIALYSIS  100 mcg Intravenous Q Sat-HD  . doxercalciferol  2 mcg Intravenous Q T,Th,Sa-HD  . insulin aspart  0-9 Units Subcutaneous TID WC  . insulin NPH Human  15 Units Subcutaneous Q breakfast  . levothyroxine  150 mcg Oral QAC breakfast  .  multivitamin  1 tablet Oral QHS  . pantoprazole  40 mg Oral Daily  . sevelamer carbonate  3,200 mg Oral BID WC  . sodium chloride flush  3 mL Intravenous Q12H  . sodium chloride flush  3 mL Intravenous Q12H   Continuous Infusions:   LOS: 2 days    Time spent: 30 minutes   Noralee Stain, DO [facility] [email or web address] Password [facility] 01/12/2017, 12:25 PM

## 2017-01-12 NOTE — Evaluation (Signed)
Physical Therapy Evaluation Patient Details Name: Matthew Espinoza MRN: [id number] DOB: Aug 04, 1941 Today's Date: 01/12/2017   History of Present Illness  Pt is a 76 yo male admitted through ED on 01/10/17 following a fall resulting in a left sided hematoma. Pt is on anticoagulation therapy for A-fib and is ESRD. Pt's HGB had dropped from 9-7.4 and he was transfused on 01/12/17. PMH significant for DM2, hypotension, hypothyroidism, HLD, GERD.    Clinical Impression  Pt presents with the above diagnosis and below deficits for therapy evaluation. Prior to admission pt was living alone and completely independent including transporting himself to dialysis. Pt now presents with the below deficits which are limiting him from returning home alone. Pt plans to DC home with his daughter who will assist until he is able to safely return home. Pt is able to maneuver with Min guard A for a majority of activities this session and will benefit from continued acute PT follow-up in order to maximize his functional outcomes before discharge.     Follow Up Recommendations No PT follow up;Supervision for mobility/OOB    Equipment Recommendations  None recommended by PT    Recommendations for Other Services       Precautions / Restrictions Precautions Precautions: Fall Precaution Comments: Fall resulted in current hospitalizaiton Restrictions Weight Bearing Restrictions: No      Mobility  Bed Mobility               General bed mobility comments: OOB in recliner when PT arrives  Transfers Overall transfer level: Needs assistance Equipment used: None Transfers: Sit to/from Stand Sit to Stand: Min guard         General transfer comment: Min guard for safety, pt takes 3-4 attempts before he is able to stand from recliner. Performed x 2  Ambulation/Gait Ambulation/Gait assistance: Min guard Ambulation Distance (Feet): 150 Feet Assistive device: None Gait Pattern/deviations: Step-through  pattern;Decreased step length - right;Decreased step length - left;Wide base of support Gait velocity: decreased Gait velocity interpretation: Below normal speed for age/gender General Gait Details: Pt with toe out, WBOS, and decreased cadence.  Stairs            Wheelchair Mobility    Modified Rankin (Stroke Patients Only)       Balance Overall balance assessment: Needs assistance Sitting-balance support: No upper extremity supported;Feet supported Sitting balance-Leahy Scale: Good     Standing balance support: No upper extremity supported Standing balance-Leahy Scale: Good Standing balance comment: Able to stand with wider BOS and maintain balance even with gait         Rhomberg - Eyes Opened: 30 Rhomberg - Eyes Closed: 2   High Level Balance Comments: Uable to maintain balance with eyes closed. immediately starts to fall backwards.              Pertinent Vitals/Pain Pain Assessment: No/denies pain    Home Living Family/patient expects to be discharged to:: Private residence Living Arrangements: Children (lives alone, but going to stay with his daughter) Available Help at Discharge: Family;Available 24 hours/day Type of Home: House Home Access: Stairs to enter Entrance Stairs-Rails: Right Entrance Stairs-Number of Steps: 2-3 Home Layout: One level Home Equipment: Cane - single point      Prior Function Level of Independence: Independent         Comments: lives alone, drives to dialysis and uses cane occasionally     Hand Dominance   Dominant Hand: Right    Extremity/Trunk Assessment   Upper Extremity  Assessment Upper Extremity Assessment: Defer to OT evaluation    Lower Extremity Assessment Lower Extremity Assessment: Overall WFL for tasks assessed (Strength tests and grossly 4/5 bilaterally LE's. )    Cervical / Trunk Assessment Cervical / Trunk Assessment: Normal  Communication   Communication: No difficulties  Cognition  Arousal/Alertness: Awake/alert Behavior During Therapy: WFL for tasks assessed/performed Overall Cognitive Status: Within Functional Limits for tasks assessed                                        General Comments      Exercises     Assessment/Plan    PT Assessment Patient needs continued PT services  PT Problem List Decreased activity tolerance;Decreased balance;Decreased mobility       PT Treatment Interventions DME instruction;Gait training;Stair training;Functional mobility training;Therapeutic activities;Therapeutic exercise;Balance training    PT Goals (Current goals can be found in the Care Plan section)  Acute Rehab PT Goals Patient Stated Goal: to get back home with his daugther PT Goal Formulation: With patient Time For Goal Achievement: 01/19/17 Potential to Achieve Goals: Good    Frequency Min 3X/week   Barriers to discharge        Co-evaluation               End of Session Equipment Utilized During Treatment: Gait belt Activity Tolerance: Patient tolerated treatment well Patient left: in chair;with call bell/phone within reach Nurse Communication: Mobility status PT Visit Diagnosis: Difficulty in walking, not elsewhere classified (R26.2);Unsteadiness on feet (R26.81)    Time: [id number] PT Time Calculation (min) (ACUTE ONLY): 13 min   Charges:   PT Evaluation $PT Eval Moderate Complexity: 1 Procedure     PT G Codes:        Colin BroachSabra M. Pravin Perezperez PT, DPT  [phone]   Roxy MannsSabra Marie Lorae Roig 01/12/2017, 3:32 PM

## 2017-01-13 DIAGNOSIS — D62 Acute posthemorrhagic anemia: Secondary | ICD-10-CM

## 2017-01-13 DIAGNOSIS — S301XXD Contusion of abdominal wall, subsequent encounter: Secondary | ICD-10-CM

## 2017-01-13 LAB — BPAM RBC
Blood Product Expiration Date: 201804122359
ISSUE DATE / TIME: 201803251052
Unit Type and Rh: 6200

## 2017-01-13 LAB — TYPE AND SCREEN
ABO/RH(D): A POS
ANTIBODY SCREEN: NEGATIVE
UNIT DIVISION: 0

## 2017-01-13 LAB — PROTIME-INR
INR: 1.96
Prothrombin Time: 22.6 seconds — ABNORMAL HIGH (ref 11.4–15.2)

## 2017-01-13 LAB — BASIC METABOLIC PANEL
ANION GAP: 10 (ref 5–15)
BUN: 25 mg/dL — AB (ref 6–20)
CHLORIDE: 97 mmol/L — AB (ref 101–111)
CO2: 29 mmol/L (ref 22–32)
Calcium: 9.8 mg/dL (ref 8.9–10.3)
Creatinine, Ser: 5.3 mg/dL — ABNORMAL HIGH (ref 0.61–1.24)
GFR calc Af Amer: 11 mL/min — ABNORMAL LOW (ref >60)
GFR calc non Af Amer: 9 mL/min — ABNORMAL LOW (ref >60)
Glucose, Bld: 118 mg/dL — ABNORMAL HIGH (ref 65–99)
POTASSIUM: 4.2 mmol/L (ref 3.5–5.1)
SODIUM: 136 mmol/L (ref 135–145)

## 2017-01-13 LAB — GLUCOSE, CAPILLARY
GLUCOSE-CAPILLARY: 138 mg/dL — AB (ref 65–99)
GLUCOSE-CAPILLARY: 281 mg/dL — AB (ref 65–99)

## 2017-01-13 LAB — CBC
HCT: 26.3 % — ABNORMAL LOW (ref 39.0–52.0)
Hemoglobin: 8.4 g/dL — ABNORMAL LOW (ref 13.0–17.0)
MCH: 30 pg (ref 26.0–34.0)
MCHC: 31.9 g/dL (ref 30.0–36.0)
MCV: 93.9 fL (ref 78.0–100.0)
PLATELETS: 140 10*3/uL — AB (ref 150–400)
RBC: 2.8 MIL/uL — AB (ref 4.22–5.81)
RDW: 14.7 % (ref 11.5–15.5)
WBC: 9.9 10*3/uL (ref 4.0–10.5)

## 2017-01-13 MED ORDER — WARFARIN SODIUM 2 MG PO TABS: ORAL_TABLET | ORAL | 4 refills | Status: DC

## 2017-01-13 NOTE — Discharge Instructions (Signed)
-  DO NOT TAKE YOUR COUMADIN (WARFARIN) UNTIL Friday, January 17, 2017 when you should go back to taking it as you were before your admission.  -Keep your scheduled dialysis treatments on Tues/Thurs/Sat, and inform the center that you need to have your hemoglobin (a blood count) checked on 3/27  -HAVE YOUR INR (blood thinning level) checked on Monday, January 20, 2017 at your usual coumadin clinic [facility] [place])    Contusion  A contusion is a deep bruise. Contusions happen when an injury causes bleeding under the skin. Symptoms of bruising include pain, swelling, and discolored skin. The skin may turn blue, purple, or yellow. Follow these instructions at home:  Rest the injured area.  If told, put ice on the injured area.  Put ice in a plastic bag.  Place a towel between your skin and the bag.  Leave the ice on for 20 minutes, 2-3 times per day.  If told, put light pressure (compression) on the injured area using an elastic bandage. Make sure the bandage is not too tight. Remove it and put it back on as told by your doctor.  If possible, raise (elevate) the injured area above the level of your heart while you are sitting or lying down.  Take over-the-counter and prescription medicines only as told by your doctor. Contact a doctor if:  Your symptoms do not get better after several days of treatment.  Your symptoms get worse.  You have trouble moving the injured area. Get help right away if:  You have very bad pain.  You have a loss of feeling (numbness) in a hand or foot.  Your hand or foot turns pale or cold. This information is not intended to replace advice given to you by your health care provider. Make sure you discuss any questions you have with your health care provider. Document Released: 03/25/2008 Document Revised: 03/14/2016 Document Reviewed: 02/22/2015 Elsevier Interactive Patient Education  2017 [employer]

## 2017-01-13 NOTE — Progress Notes (Signed)
Discharge instructions, RX's, and follow up appts explained and provided to patient verbalized understanding. Patient left floor via wheelchair accompanied by staff no c/o pain or shortness of breath at d/c.  Audric Venn Lynn, RN  

## 2017-01-13 NOTE — Care Management Note (Signed)
Case Management Note  Patient Details  Name: Matthew Espinoza MRN: [id number] Date of Birth: 06/06/1941  Subjective/Objective:   Pt is a 76 yo male admitted through ED on 01/10/17 following a fall resulting in a left sided hematoma.  PTA, pt independent and resided at home alone.                   Action/Plan: Pt for discharge home today with his daughter.  He plans to stay with her until able to return to his home safely alone.  PT recommending no OP therapy or DME.    Expected Discharge Date:  01/13/17               Expected Discharge Plan:  Home/Self Care  In-House Referral:     Discharge planning Services  CM Consult  Post Acute Care Choice:    Choice offered to:     DME Arranged:    DME Agency:     HH Arranged:    HH Agency:     Status of Service:  Completed, signed off  If discussed at [employer] Length of Stay Meetings, dates discussed:    Additional Comments:  Glennon Macmerson, Nekeya Briski M, RN 01/13/2017, 2:00 PM

## 2017-01-13 NOTE — Progress Notes (Signed)
[facility] Rounding Note Subjective:    Plans for discharge today Transfusion for Hb nadir 7.2->8.4 Tomorrow usual HD day and will resume as outpt  Objective Vital signs in last 24 hours: Vitals:   01/13/17 0800 01/13/17 0900 01/13/17 0911 01/13/17 1100  BP: 106/63 132/85 132/85 (!) 113/59  Pulse: 92 100 (!) 103 99  Resp:      Temp:   98.3 F (36.8 C)   TempSrc:   Oral   SpO2: 94% 99% 100% 100%  Weight:      Height:       Weight change:   Intake/Output Summary (Last 24 hours) at 01/13/17 1220 Last data filed at 01/12/17 1315  Gross per 24 hour  Intake              405 ml  Output                0 ml  Net              405 ml   Physical Exam: General: NAD Heart: Normal heart sounds Lungs: Clear Abdomen: soft, non tender Extremities: no edema Dialysis Access: right upper arm AVF  + bruit   Dialysis Orders: [facility] 2 K 2.5 Ca 450/600 NO HEPARIN 4 hr EDW 84 temp 35  right upper AVF Pre HD BP 3/23 was 150s/80s sitting and standing and 99/55 sitting and 100/86 standing P 86--pre HD weight was 86 with post 84.2 Epogen 1000, Zone Bar, hectorol 2 sensipar 30  Recent labs: hgb 10.3 3/13 Hep B sAG 2/22 neg and HBsAB 3 07/2016  Assessment/Plan: 1. Left hemothorax - secondary to fall with slightly elevated INR- Hb nadir 7.2 and required transfusion 2. ESRD -  TTS via AVF - next HD tomorrow at his outpt unit 3. Anemia  - hgb 9.1 per admitting note - last outpatient Hgb was 10.3 on 3/13- nadir 7.2 this admit - transfusion->8.4. 4. Metabolic bone disease -  On 4 renvela with meals and phoslo , hectorol  2 and sensipar 30 - calc and phos OK 5. Nutrition - renal carb mod/vit 6. DM- per primary BS elevated 7. AFib - INR 3.9 - now 2.1 on MTP 1/2 25 mg per day but doesn't take regularly -  118/65 and P 85 at 3/2 OV with cardiology 8. Dispo - fine to leave from renal standpoint   Camille Bal, MD [facility] [phone] Pager 01/13/2017, 12:23 PM   Labs: Basic Metabolic  Panel:  Recent Labs Lab 01/11/17 0243 01/12/17 0317 01/13/17 0242  NA 139  138 136 136  K 3.9  3.9 4.2 4.2  CL 98*  97* 98* 97*  CO2 29  30 30 29   GLUCOSE 259*  264* 245* 118*  BUN 27*  26* 13 25*  CREATININE 5.80*  5.74* 3.76* 5.30*  CALCIUM 9.6  9.6 8.9 9.8  PHOS 4.2  --   --     Recent Labs Lab 01/11/17 0243  AST 26  ALT 23  ALKPHOS 105  BILITOT 1.3*  PROT 6.0*  ALBUMIN 2.8*  2.8*     Recent Labs Lab 01/10/17 1651  01/11/17 0243 01/12/17 0317 01/12/17 1505 01/13/17 0242  WBC 11.0*  --  12.2* 9.6 11.2* 9.9  NEUTROABS 8.1*  --   --   --   --   --   HGB 8.3*  < > 8.0* 7.2* 8.9* 8.4*  HCT 25.8*  < > 25.0* 22.3* 26.7* 26.3*  MCV 93.8  --  93.6 94.5 92.7 93.9  PLT 141*  --  141* 130* 135* 140*  < > = values in this interval not displayed.   Recent Labs Lab 01/10/17 2215 01/11/17 0243  TROPONINI 0.07* 0.09*    Recent Labs Lab 01/12/17 0753 01/12/17 1201 01/12/17 1635 01/12/17 2131 01/13/17 0845  GLUCAP 230* 236* 227* 166* 138*    Iron/TIBC/Ferritin/ %Sat    Component Value Date/Time   IRON 75 01/11/2017 1925   TIBC 248 (L) 01/11/2017 1925   IRONPCTSAT 30 01/11/2017 1925   Studies/Results: Ct Abdomen Pelvis Wo Contrast  Result Date: 01/12/2017 CLINICAL DATA:  Follow-up hematoma left flank region, on warfarin EXAM: CT ABDOMEN AND PELVIS WITHOUT CONTRAST TECHNIQUE: Multidetector CT imaging of the abdomen and pelvis was performed following the standard protocol without IV contrast. COMPARISON:  01/10/2017 FINDINGS: Lower chest: Cardiomegaly again noted. Atherosclerotic calcifications of coronary arteries. Lung bases shows no acute findings. No lower rib fractures are noted. Hepatobiliary: Again noted status post cholecystectomy. No intrahepatic biliary ductal dilatation. Pancreas: Unremarkable. No pancreatic ductal dilatation or surrounding inflammatory changes. Spleen: No splenic injury or perisplenic hematoma. Small accessory splenule again  noted. Adrenals/Urinary Tract: Again noted bilateral cortical thinning and renal atrophy. Stable bilateral renal cysts and hyperattenuating lesions probable hemorrhagic cysts. This cannot be characterized without IV contrast. No hydronephrosis or hydroureter. Stomach/Bowel: There is no small bowel obstruction. No thickened or dilated small bowel loops 3. Some colonic stool are noted in right colon and transverse colon. Colonic stool noted in distal descending colon and proximal sigmoid colon. Some colonic gas noted mid sigmoid colon. Moderate stool noted within rectum. The rectum measures 6 cm in diameter. No evidence of colitis or diverticulitis. Normal appendix partially visualized in axial image 72. Vascular/Lymphatic: Again noted atherosclerotic calcifications of abdominal aorta and iliac arteries. No retroperitoneal or mesenteric adenopathy. Reproductive: No pelvic mass or adenopathy. Limited assessment of urinary bladder which is empty. Prostate gland is normal size. Other: Again noted a large hematoma within left inferior posterolateral thoracic musculature/ left flank. On axial image 18 measures 15 x 6.3 cm without significant change from prior exam. On coronal image 94 measures about 18 cm cranial caudally by 7.4 cm transverse dimension. There is no significant change in size in appearance from prior exam. There is stranding of subcutaneous fat in left flank probable contusion or adjacent edema. Musculoskeletal: Sagittal images of the spine shows mild degenerative changes lower thoracic spine. No acute fractures are noted. No pelvic fractures are not identified. IMPRESSION: 1. Again noted a large hematoma within left inferior posterolateral thoracic musculature/ left flank. On axial image 18 measures 15 x 6.3 cm without significant change from prior exam. On coronal image 94 measures about 18 cm cranial caudally by 7.4 cm transverse dimension. There is no significant change in size in appearance from prior  exam. There is stranding of subcutaneous fat in left flank probable contusion or adjacent edema. 2. Status post cholecystectomy. 3. Again noted atherosclerotic calcifications of abdominal aorta iliac arteries. Atherosclerotic calcifications of coronary arteries. 4. Again noted bilateral atrophic kidneys with cortical thinning. Stable hyperdense and hypodense bilateral renal lesions which cannot be characterized without IV contrast. 5. No small bowel or colonic obstruction. No pericecal inflammation. Normal appendix partially visualized axial image 72. 6. Colonic stool as described above without definite evidence of acute colitis or diverticulitis. Electronically Signed   By: Natasha Mead M.D.   On: 01/12/2017 15:24    Scheduled Medications: . atorvastatin  80 mg Oral q1800  .  calcium acetate  667 mg Oral Q supper  . cinacalcet  30 mg Oral Q breakfast  . [START ON 01/18/2017] darbepoetin (ARANESP) injection - DIALYSIS  100 mcg Intravenous Q Sat-HD  . doxercalciferol  2 mcg Intravenous Q T,Th,Sa-HD  . insulin aspart  0-9 Units Subcutaneous TID WC  . insulin NPH Human  15 Units Subcutaneous Q breakfast  . levothyroxine  150 mcg Oral QAC breakfast  . multivitamin  1 tablet Oral QHS  . pantoprazole  40 mg Oral Daily  . sevelamer carbonate  3,200 mg Oral BID WC  . sodium chloride flush  3 mL Intravenous Q12H  . sodium chloride flush  3 mL Intravenous Q12H

## 2017-01-13 NOTE — Discharge Summary (Signed)
DISCHARGE SUMMARY  Matthew Espinoza  MR#: [id number]  DOB:October 28, 1940  Date of Admission: 01/10/2017 Date of Discharge: 01/13/2017  Attending Physician:MCCLUNG,JEFFREY T  Patient's WUJ:WJXBJY,NWGNF B, MD  Consults:  Nephrology   Disposition: D/C home   Follow-up Appts: Follow-up Information    TAPPER,DAVID B, MD. Schedule an appointment as soon as possible for a visit in 5 day(s).   Specialty:  Family Medicine Contact information: [address] Raeanne Gathers [place] [address] [phone]        [facility] Follow up.   Specialty:  Cardiology Why:  Have your INR checked at the [facility] Cardiology clinic as you usually do on Monday, January 20, 2017 Contact information: [address] [place] [address] [phone]       [facility] Follow up.   Why:  Have your Hemoglobin (blood level) checked during your dialysis treatment on 01/14/17          Tests Needing Follow-up: -assessment of INR is suggested on Monday, 01/20/17 -recheck of CBC is suggested with his next HD tx on 01/14/17  Discharge Diagnoses: Left hemithorax hematoma Acute blood loss anemia on anemia of chronic kidney disease Hypotension Chronic atrial fibrillation End-stage renal disease on hemodialysis Type 2 diabetes mellitus Hypothyroidism HLD GERD  Initial presentation: 76 y.o.malewith history of ESRD on HD and atrial fibrillation on warfarin, who presented from [facility] due to a left chest wall hematoma. Patient tripped and fell when he missed a step, landing on his left side, hitting the ground with his left thorax, developing pain and swelling, required assistance to get back on his feet. Over the course of the following hours he developed a mass on his left posterior thorax which was worsening, firm, and associated with dizziness and lightheadedness. EMS was called and he was found to be hypotensive. He was taken to the emergency room  at [facility] ED, where no hemodialysis is available. Patient was admitted due to a left flank/hemithorax hematoma, hypotension.   Hospital Course:  Left hemithorax hematoma Due to traumatic fall while taking coumadin - hematoma size stable on f/u CT 3/25 - Hgb holding steady - no more orthostatic/presyncopal sx - warned pt to return to ED if sx recur - to hold coumadin until 3/30 - counseled on risk of CVA while coumadin on hold, but that too high risk for ongoing bleeding w/ use of coumadin for now   Acute blood loss anemia on anemia of chronic kidney disease Transfused 1U PRBC this admit - Hgb stable at d/c - recommend recheck of Hgb w/ next HD tx  Hypotension - acute on chronic  Patient reports systolic in 100s at baseline - due to hypovolemia due to blood loss - resolved   Chronic atrial fibrillation CHADSVASc > 3 - continue metoprolol as tolerated - holding anticoagulation as discussed above - to resume on 01/17/17  End-stage renal disease on hemodialysis Nephrology providing for HD during admission - to resume usual home T/Th/Sat HD schedule after d/c   DM2 CBG variable - no change in usual tx plan at time of d/c   Hypothyroidism continue levothyroxine 150 mg daily  HLD continue lipitor   GERD PPI   Allergies as of 01/13/2017   No Known Allergies     Medication List    TAKE these medications   folic acid-vitamin b complex-vitamin c-selenium-zinc 3 MG Tabs tablet Take 1 tablet by mouth daily.   hydrOXYzine 25 MG tablet Commonly known as:  ATARAX/VISTARIL Take 25 mg by mouth at bedtime as needed for itching.   levothyroxine 150 MCG tablet Commonly known as:  SYNTHROID, LEVOTHROID Take 150 mcg by mouth daily.   LIPITOR 80 MG tablet Generic drug:  atorvastatin TAKE 1 BY MOUTH EVERY      EVENING   MIRALAX powder Generic drug:  polyethylene glycol powder Take 17 g by mouth as needed for mild constipation.   NOVOLIN N 100 UNIT/ML injection Generic drug:   insulin NPH Human Inject 35 Units into the skin daily.   omeprazole 20 MG capsule Commonly known as:  PRILOSEC Take 20 mg by mouth daily.   RENVELA 800 MG tablet Generic drug:  sevelamer carbonate Take 3,200 mg by mouth 2 (two) times daily.   SENSIPAR 30 MG tablet Generic drug:  cinacalcet Take 30 mg by mouth daily.   TOPROL XL 25 MG 24 hr tablet Generic drug:  metoprolol succinate TAKE 1/2 BY MOUTH DAILY    *DOSE DECREASE*   warfarin 2 MG tablet Commonly known as:  COUMADIN TAKE 1/2 TO 1 TABLET BY MOUTH DAILY ALTERNATING BETWEEN 1MG  AND 2MG  DAILY Start taking on:  01/17/2017 What changed:  See the new instructions.       Day of Discharge BP 132/85 (BP Location: Left Arm)   Pulse (!) 103   Temp 98.3 F (36.8 C) (Oral)   Resp 18   Ht 5\' 11"  (1.803 m)   Wt 83.2 kg (183 lb 6.8 oz)   SpO2 100%   BMI 25.58 kg/m   Physical Exam: General: No acute respiratory distress Lungs: Clear to auscultation bilaterally without wheezes or crackles Cardiovascular: Regular rate and rhythm without murmur  Chest: ~tennis ball sized hematoma at inferior aspect of L scapula - diffuse L flank ecchymosis inferior to hematoma - no erythema  Abdomen: Nontender, nondistended, soft, bowel sounds positive, no rebound, no ascites, no appreciable mass Extremities: No significant cyanosis, clubbing, or edema bilateral lower extremities  Basic Metabolic Panel:  Recent Labs Lab 01/10/17 1651 01/11/17 0243 01/12/17 0317 01/13/17 0242  NA 137 139  138 136 136  K 3.8 3.9  3.9 4.2 4.2  CL 96* 98*  97* 98* 97*  CO2 28 29  30 30 29   GLUCOSE 320* 259*  264* 245* 118*  BUN 23* 27*  26* 13 25*  CREATININE 5.08* 5.80*  5.74* 3.76* 5.30*  CALCIUM 9.4 9.6  9.6 8.9 9.8  PHOS  --  4.2  --   --     Liver Function Tests:  Recent Labs Lab 01/11/17 0243  AST 26  ALT 23  ALKPHOS 105  BILITOT 1.3*  PROT 6.0*  ALBUMIN 2.8*  2.8*   Coags:  Recent Labs Lab 01/09/17 1130 01/10/17 1651  01/11/17 0243 01/12/17 0317 01/13/17 0242  INR 3.9 2.62 2.41 2.15 1.96    CBC:  Recent Labs Lab 01/10/17 1651 01/10/17 2137 01/11/17 0243 01/12/17 0317 01/12/17 1505 01/13/17 0242  WBC 11.0*  --  12.2* 9.6 11.2* 9.9  NEUTROABS 8.1*  --   --   --   --   --   HGB 8.3* 7.8* 8.0* 7.2* 8.9* 8.4*  HCT 25.8* 24.6* 25.0* 22.3* 26.7* 26.3*  MCV 93.8  --  93.6 94.5 92.7 93.9  PLT 141*  --  141* 130* 135* 140*    Cardiac Enzymes:  Recent Labs Lab 01/10/17 2215 01/11/17 0243  TROPONINI 0.07* 0.09*    CBG:  Recent Labs Lab 01/12/17 0753 01/12/17 1201 01/12/17 1635  01/12/17 2131 01/13/17 0845  GLUCAP 230* 236* 227* 166* 138*    Recent Results (from the past 240 hour(s))  MRSA PCR Screening     Status: None   Collection Time: 01/10/17 11:15 PM  Result Value Ref Range Status   MRSA by PCR NEGATIVE NEGATIVE Final    Comment:        The GeneXpert MRSA Assay (FDA approved for NASAL specimens only), is one component of a comprehensive MRSA colonization surveillance program. It is not intended to diagnose MRSA infection nor to guide or monitor treatment for MRSA infections.      Time spent in discharge (includes decision making & examination of pt): <30 minutes  01/13/2017, 10:53 AM   Lonia Blood, MD [facility] Office  [phone] Pager [phone]  On-Call/Text Page:      [email or web address]      password [facility]

## 2017-01-21 ENCOUNTER — Ambulatory Visit (INDEPENDENT_AMBULATORY_CARE_PROVIDER_SITE_OTHER): Payer: Medicare Other | Admitting: *Deleted

## 2017-01-21 DIAGNOSIS — I2510 Atherosclerotic heart disease of native coronary artery without angina pectoris: Secondary | ICD-10-CM | POA: Diagnosis not present

## 2017-01-21 DIAGNOSIS — I4891 Unspecified atrial fibrillation: Secondary | ICD-10-CM | POA: Diagnosis not present

## 2017-01-21 DIAGNOSIS — Z5181 Encounter for therapeutic drug level monitoring: Secondary | ICD-10-CM

## 2017-01-21 LAB — POCT INR: INR: 2

## 2017-01-23 ENCOUNTER — Other Ambulatory Visit: Payer: Self-pay | Admitting: Cardiology

## 2017-02-04 ENCOUNTER — Ambulatory Visit (INDEPENDENT_AMBULATORY_CARE_PROVIDER_SITE_OTHER): Payer: Medicare Other | Admitting: *Deleted

## 2017-02-04 DIAGNOSIS — I2510 Atherosclerotic heart disease of native coronary artery without angina pectoris: Secondary | ICD-10-CM | POA: Diagnosis not present

## 2017-02-04 DIAGNOSIS — I4891 Unspecified atrial fibrillation: Secondary | ICD-10-CM | POA: Diagnosis not present

## 2017-02-04 DIAGNOSIS — Z5181 Encounter for therapeutic drug level monitoring: Secondary | ICD-10-CM

## 2017-02-04 LAB — POCT INR: INR: 4.4

## 2017-02-13 ENCOUNTER — Ambulatory Visit (INDEPENDENT_AMBULATORY_CARE_PROVIDER_SITE_OTHER): Payer: Medicare Other | Admitting: *Deleted

## 2017-02-13 DIAGNOSIS — Z5181 Encounter for therapeutic drug level monitoring: Secondary | ICD-10-CM

## 2017-02-13 DIAGNOSIS — I4891 Unspecified atrial fibrillation: Secondary | ICD-10-CM

## 2017-02-13 DIAGNOSIS — I2510 Atherosclerotic heart disease of native coronary artery without angina pectoris: Secondary | ICD-10-CM | POA: Diagnosis not present

## 2017-02-13 LAB — POCT INR: INR: 2.3

## 2017-02-13 MED ORDER — WARFARIN SODIUM 2 MG PO TABS: ORAL_TABLET | ORAL | 4 refills | Status: DC

## 2017-03-06 ENCOUNTER — Ambulatory Visit (INDEPENDENT_AMBULATORY_CARE_PROVIDER_SITE_OTHER): Payer: Medicare Other | Admitting: *Deleted

## 2017-03-06 DIAGNOSIS — I2510 Atherosclerotic heart disease of native coronary artery without angina pectoris: Secondary | ICD-10-CM | POA: Diagnosis not present

## 2017-03-06 DIAGNOSIS — Z5181 Encounter for therapeutic drug level monitoring: Secondary | ICD-10-CM | POA: Diagnosis not present

## 2017-03-06 DIAGNOSIS — I4891 Unspecified atrial fibrillation: Secondary | ICD-10-CM

## 2017-03-06 LAB — POCT INR: INR: 1.8

## 2017-03-27 ENCOUNTER — Ambulatory Visit (INDEPENDENT_AMBULATORY_CARE_PROVIDER_SITE_OTHER): Payer: Medicare Other | Admitting: *Deleted

## 2017-03-27 DIAGNOSIS — I4891 Unspecified atrial fibrillation: Secondary | ICD-10-CM

## 2017-03-27 DIAGNOSIS — Z5181 Encounter for therapeutic drug level monitoring: Secondary | ICD-10-CM

## 2017-03-27 DIAGNOSIS — I2510 Atherosclerotic heart disease of native coronary artery without angina pectoris: Secondary | ICD-10-CM | POA: Diagnosis not present

## 2017-03-27 LAB — POCT INR: INR: 3.7

## 2017-04-04 ENCOUNTER — Encounter (INDEPENDENT_AMBULATORY_CARE_PROVIDER_SITE_OTHER): Payer: Medicare Other | Admitting: Ophthalmology

## 2017-04-04 DIAGNOSIS — H35032 Hypertensive retinopathy, left eye: Secondary | ICD-10-CM | POA: Diagnosis not present

## 2017-04-04 DIAGNOSIS — H43812 Vitreous degeneration, left eye: Secondary | ICD-10-CM | POA: Diagnosis not present

## 2017-04-04 DIAGNOSIS — E113512 Type 2 diabetes mellitus with proliferative diabetic retinopathy with macular edema, left eye: Secondary | ICD-10-CM

## 2017-04-04 DIAGNOSIS — H4311 Vitreous hemorrhage, right eye: Secondary | ICD-10-CM

## 2017-04-04 DIAGNOSIS — E11311 Type 2 diabetes mellitus with unspecified diabetic retinopathy with macular edema: Secondary | ICD-10-CM | POA: Diagnosis not present

## 2017-04-04 DIAGNOSIS — I10 Essential (primary) hypertension: Secondary | ICD-10-CM | POA: Diagnosis not present

## 2017-04-10 ENCOUNTER — Ambulatory Visit (INDEPENDENT_AMBULATORY_CARE_PROVIDER_SITE_OTHER): Payer: Medicare Other | Admitting: *Deleted

## 2017-04-10 DIAGNOSIS — I2510 Atherosclerotic heart disease of native coronary artery without angina pectoris: Secondary | ICD-10-CM | POA: Diagnosis not present

## 2017-04-10 DIAGNOSIS — I4891 Unspecified atrial fibrillation: Secondary | ICD-10-CM

## 2017-04-10 DIAGNOSIS — Z5181 Encounter for therapeutic drug level monitoring: Secondary | ICD-10-CM

## 2017-04-10 LAB — POCT INR: INR: 7.6

## 2017-04-15 ENCOUNTER — Ambulatory Visit (INDEPENDENT_AMBULATORY_CARE_PROVIDER_SITE_OTHER): Payer: Medicare Other | Admitting: *Deleted

## 2017-04-15 DIAGNOSIS — Z5181 Encounter for therapeutic drug level monitoring: Secondary | ICD-10-CM | POA: Diagnosis not present

## 2017-04-15 DIAGNOSIS — I4891 Unspecified atrial fibrillation: Secondary | ICD-10-CM | POA: Diagnosis not present

## 2017-04-15 DIAGNOSIS — I2510 Atherosclerotic heart disease of native coronary artery without angina pectoris: Secondary | ICD-10-CM | POA: Diagnosis not present

## 2017-04-15 LAB — POCT INR: INR: 1.4

## 2017-04-18 ENCOUNTER — Encounter (INDEPENDENT_AMBULATORY_CARE_PROVIDER_SITE_OTHER): Payer: Medicare Other | Admitting: Ophthalmology

## 2017-04-18 DIAGNOSIS — E113592 Type 2 diabetes mellitus with proliferative diabetic retinopathy without macular edema, left eye: Secondary | ICD-10-CM

## 2017-04-18 DIAGNOSIS — H35033 Hypertensive retinopathy, bilateral: Secondary | ICD-10-CM | POA: Diagnosis not present

## 2017-04-18 DIAGNOSIS — E11319 Type 2 diabetes mellitus with unspecified diabetic retinopathy without macular edema: Secondary | ICD-10-CM

## 2017-04-18 DIAGNOSIS — H43812 Vitreous degeneration, left eye: Secondary | ICD-10-CM | POA: Diagnosis not present

## 2017-04-18 DIAGNOSIS — H4311 Vitreous hemorrhage, right eye: Secondary | ICD-10-CM

## 2017-04-18 DIAGNOSIS — I10 Essential (primary) hypertension: Secondary | ICD-10-CM

## 2017-04-22 ENCOUNTER — Ambulatory Visit (INDEPENDENT_AMBULATORY_CARE_PROVIDER_SITE_OTHER): Payer: Medicare Other | Admitting: *Deleted

## 2017-04-22 DIAGNOSIS — Z5181 Encounter for therapeutic drug level monitoring: Secondary | ICD-10-CM

## 2017-04-22 DIAGNOSIS — I2510 Atherosclerotic heart disease of native coronary artery without angina pectoris: Secondary | ICD-10-CM

## 2017-04-22 DIAGNOSIS — I4891 Unspecified atrial fibrillation: Secondary | ICD-10-CM

## 2017-04-22 LAB — POCT INR: INR: 2.4

## 2017-05-01 ENCOUNTER — Ambulatory Visit (INDEPENDENT_AMBULATORY_CARE_PROVIDER_SITE_OTHER): Payer: Medicare Other | Admitting: *Deleted

## 2017-05-01 DIAGNOSIS — Z5181 Encounter for therapeutic drug level monitoring: Secondary | ICD-10-CM | POA: Diagnosis not present

## 2017-05-01 DIAGNOSIS — I2510 Atherosclerotic heart disease of native coronary artery without angina pectoris: Secondary | ICD-10-CM | POA: Diagnosis not present

## 2017-05-01 DIAGNOSIS — I4891 Unspecified atrial fibrillation: Secondary | ICD-10-CM

## 2017-05-01 LAB — POCT INR: INR: 3.1

## 2017-05-09 ENCOUNTER — Encounter (INDEPENDENT_AMBULATORY_CARE_PROVIDER_SITE_OTHER): Payer: Medicare Other | Admitting: Ophthalmology

## 2017-05-09 DIAGNOSIS — E11319 Type 2 diabetes mellitus with unspecified diabetic retinopathy without macular edema: Secondary | ICD-10-CM

## 2017-05-09 DIAGNOSIS — E113592 Type 2 diabetes mellitus with proliferative diabetic retinopathy without macular edema, left eye: Secondary | ICD-10-CM | POA: Diagnosis not present

## 2017-05-09 DIAGNOSIS — H43812 Vitreous degeneration, left eye: Secondary | ICD-10-CM

## 2017-05-09 DIAGNOSIS — I10 Essential (primary) hypertension: Secondary | ICD-10-CM

## 2017-05-09 DIAGNOSIS — H35033 Hypertensive retinopathy, bilateral: Secondary | ICD-10-CM | POA: Diagnosis not present

## 2017-05-09 DIAGNOSIS — H4311 Vitreous hemorrhage, right eye: Secondary | ICD-10-CM

## 2017-05-15 ENCOUNTER — Ambulatory Visit (INDEPENDENT_AMBULATORY_CARE_PROVIDER_SITE_OTHER): Payer: Medicare Other | Admitting: *Deleted

## 2017-05-15 DIAGNOSIS — I2510 Atherosclerotic heart disease of native coronary artery without angina pectoris: Secondary | ICD-10-CM

## 2017-05-15 DIAGNOSIS — I4891 Unspecified atrial fibrillation: Secondary | ICD-10-CM

## 2017-05-15 DIAGNOSIS — Z5181 Encounter for therapeutic drug level monitoring: Secondary | ICD-10-CM

## 2017-05-15 LAB — POCT INR: INR: 3.2

## 2017-05-29 ENCOUNTER — Ambulatory Visit (INDEPENDENT_AMBULATORY_CARE_PROVIDER_SITE_OTHER): Payer: Medicare Other | Admitting: *Deleted

## 2017-05-29 DIAGNOSIS — I4891 Unspecified atrial fibrillation: Secondary | ICD-10-CM | POA: Diagnosis not present

## 2017-05-29 DIAGNOSIS — Z5181 Encounter for therapeutic drug level monitoring: Secondary | ICD-10-CM

## 2017-05-29 DIAGNOSIS — I2510 Atherosclerotic heart disease of native coronary artery without angina pectoris: Secondary | ICD-10-CM | POA: Diagnosis not present

## 2017-05-29 LAB — POCT INR: INR: 2

## 2017-06-19 ENCOUNTER — Ambulatory Visit (INDEPENDENT_AMBULATORY_CARE_PROVIDER_SITE_OTHER): Payer: Medicare Other | Admitting: *Deleted

## 2017-06-19 DIAGNOSIS — I4891 Unspecified atrial fibrillation: Secondary | ICD-10-CM

## 2017-06-19 DIAGNOSIS — I2510 Atherosclerotic heart disease of native coronary artery without angina pectoris: Secondary | ICD-10-CM

## 2017-06-19 DIAGNOSIS — Z5181 Encounter for therapeutic drug level monitoring: Secondary | ICD-10-CM

## 2017-06-19 LAB — POCT INR: INR: 1.5

## 2017-07-03 ENCOUNTER — Ambulatory Visit (INDEPENDENT_AMBULATORY_CARE_PROVIDER_SITE_OTHER): Payer: Medicare Other | Admitting: *Deleted

## 2017-07-03 DIAGNOSIS — Z5181 Encounter for therapeutic drug level monitoring: Secondary | ICD-10-CM | POA: Diagnosis not present

## 2017-07-03 DIAGNOSIS — I4892 Unspecified atrial flutter: Secondary | ICD-10-CM

## 2017-07-03 DIAGNOSIS — I480 Paroxysmal atrial fibrillation: Secondary | ICD-10-CM

## 2017-07-03 DIAGNOSIS — I4891 Unspecified atrial fibrillation: Secondary | ICD-10-CM | POA: Diagnosis not present

## 2017-07-03 LAB — POCT INR: INR: 2.3

## 2017-07-11 ENCOUNTER — Encounter (INDEPENDENT_AMBULATORY_CARE_PROVIDER_SITE_OTHER): Payer: Medicare Other | Admitting: Ophthalmology

## 2017-07-11 DIAGNOSIS — E113592 Type 2 diabetes mellitus with proliferative diabetic retinopathy without macular edema, left eye: Secondary | ICD-10-CM | POA: Diagnosis not present

## 2017-07-11 DIAGNOSIS — E113511 Type 2 diabetes mellitus with proliferative diabetic retinopathy with macular edema, right eye: Secondary | ICD-10-CM

## 2017-07-11 DIAGNOSIS — E11311 Type 2 diabetes mellitus with unspecified diabetic retinopathy with macular edema: Secondary | ICD-10-CM

## 2017-07-11 DIAGNOSIS — H4311 Vitreous hemorrhage, right eye: Secondary | ICD-10-CM | POA: Diagnosis not present

## 2017-07-11 DIAGNOSIS — I10 Essential (primary) hypertension: Secondary | ICD-10-CM | POA: Diagnosis not present

## 2017-07-11 DIAGNOSIS — H35033 Hypertensive retinopathy, bilateral: Secondary | ICD-10-CM

## 2017-07-24 ENCOUNTER — Ambulatory Visit (INDEPENDENT_AMBULATORY_CARE_PROVIDER_SITE_OTHER): Payer: Medicare Other | Admitting: *Deleted

## 2017-07-24 DIAGNOSIS — I4891 Unspecified atrial fibrillation: Secondary | ICD-10-CM

## 2017-07-24 DIAGNOSIS — Z5181 Encounter for therapeutic drug level monitoring: Secondary | ICD-10-CM | POA: Diagnosis not present

## 2017-07-24 LAB — POCT INR: INR: 1.8

## 2017-08-13 ENCOUNTER — Ambulatory Visit: Payer: Medicare Other | Admitting: Cardiology

## 2017-08-14 ENCOUNTER — Ambulatory Visit (INDEPENDENT_AMBULATORY_CARE_PROVIDER_SITE_OTHER): Payer: Medicare Other | Admitting: *Deleted

## 2017-08-14 DIAGNOSIS — Z5181 Encounter for therapeutic drug level monitoring: Secondary | ICD-10-CM

## 2017-08-14 DIAGNOSIS — I4891 Unspecified atrial fibrillation: Secondary | ICD-10-CM

## 2017-08-14 LAB — POCT INR: INR: 1.5

## 2017-08-22 ENCOUNTER — Other Ambulatory Visit: Payer: Self-pay | Admitting: Cardiology

## 2017-08-28 ENCOUNTER — Ambulatory Visit (INDEPENDENT_AMBULATORY_CARE_PROVIDER_SITE_OTHER): Payer: Medicare Other | Admitting: *Deleted

## 2017-08-28 DIAGNOSIS — I4891 Unspecified atrial fibrillation: Secondary | ICD-10-CM

## 2017-08-28 DIAGNOSIS — Z5181 Encounter for therapeutic drug level monitoring: Secondary | ICD-10-CM

## 2017-08-28 LAB — POCT INR: INR: 1.5

## 2017-09-09 ENCOUNTER — Ambulatory Visit (INDEPENDENT_AMBULATORY_CARE_PROVIDER_SITE_OTHER): Payer: Medicare Other | Admitting: *Deleted

## 2017-09-09 DIAGNOSIS — Z5181 Encounter for therapeutic drug level monitoring: Secondary | ICD-10-CM

## 2017-09-09 DIAGNOSIS — I4891 Unspecified atrial fibrillation: Secondary | ICD-10-CM

## 2017-09-09 LAB — POCT INR: INR: 3.5

## 2017-09-10 ENCOUNTER — Encounter (INDEPENDENT_AMBULATORY_CARE_PROVIDER_SITE_OTHER): Payer: Medicare Other | Admitting: Ophthalmology

## 2017-09-10 DIAGNOSIS — H26491 Other secondary cataract, right eye: Secondary | ICD-10-CM

## 2017-09-10 DIAGNOSIS — I10 Essential (primary) hypertension: Secondary | ICD-10-CM

## 2017-09-10 DIAGNOSIS — E11319 Type 2 diabetes mellitus with unspecified diabetic retinopathy without macular edema: Secondary | ICD-10-CM | POA: Diagnosis not present

## 2017-09-10 DIAGNOSIS — E113593 Type 2 diabetes mellitus with proliferative diabetic retinopathy without macular edema, bilateral: Secondary | ICD-10-CM | POA: Diagnosis not present

## 2017-09-10 DIAGNOSIS — H35033 Hypertensive retinopathy, bilateral: Secondary | ICD-10-CM

## 2017-09-25 ENCOUNTER — Encounter: Payer: Self-pay | Admitting: Cardiology

## 2017-09-25 ENCOUNTER — Ambulatory Visit (INDEPENDENT_AMBULATORY_CARE_PROVIDER_SITE_OTHER): Payer: Medicare Other | Admitting: *Deleted

## 2017-09-25 ENCOUNTER — Ambulatory Visit (INDEPENDENT_AMBULATORY_CARE_PROVIDER_SITE_OTHER): Payer: Medicare Other | Admitting: Cardiology

## 2017-09-25 VITALS — BP 109/70 | HR 105 | Ht 71.0 in | Wt 180.8 lb

## 2017-09-25 DIAGNOSIS — I4891 Unspecified atrial fibrillation: Secondary | ICD-10-CM

## 2017-09-25 DIAGNOSIS — I2510 Atherosclerotic heart disease of native coronary artery without angina pectoris: Secondary | ICD-10-CM | POA: Diagnosis not present

## 2017-09-25 DIAGNOSIS — E782 Mixed hyperlipidemia: Secondary | ICD-10-CM | POA: Diagnosis not present

## 2017-09-25 DIAGNOSIS — Z5181 Encounter for therapeutic drug level monitoring: Secondary | ICD-10-CM

## 2017-09-25 LAB — POCT INR: INR: 2.2

## 2017-09-25 NOTE — Patient Instructions (Addendum)
Your physician wants you to follow-up in: 6 MONTHS [facility] You will receive a reminder letter in the mail two months in advance. If you don't receive a letter, please call our office to schedule the follow-up appointment.  Your physician recommends that you continue on your current medications as directed. Please refer to the Current Medication list given to you today.  [facility]   Thank you [facility] choosing [facility]!!

## 2017-09-25 NOTE — Progress Notes (Signed)
Clinical Summary [name] is a 76 y.o.male seen today for follow up of the following medical problems.    1. CAD/ICM  - prior stenting to RCA in 2010.  11/2014 echo LVEF 50-55%  - no chest pain, no SOB  2. Afib  - followed by Dr Johney Frame in EP clinic   - no recent palpitations. No bleeding troubles on coumadin.  - trouble tolerating av nodal agents due to low bp's  3. Hyperlipidemia  - compliant with high dose statin - reports recent labs at Dialysis center.    4. ESRD - on HD Tues, Thurs, Sat - notes some occasional low bp's on HD at times   Past Medical History:  Diagnosis Date  . Anemia    chronic  . CAD (coronary artery disease)    a. abnormal perfusion imaging study with multipe, large fixed  defects, EF39% b. EF 50-55% with apical akinesis by 2D echo. Status post coronary intervention with drug-eluting stent placement x3 October 2010 EF 40-45%   . Diabetes mellitus   . Hemodialysis adequacy testing (HCC)    through a Vas-cath catheer  . HTN (hypertension)   . Hypothyroidism   . Paroxysmal atrial fibrillation (HCC)   . Renal failure    chronic, on hemodialysis  . Syncope    s/p ILR no recurrent syncope and negative for arrhythmias last interrogation December 2012  . Ventricular ectopy    History of nonsustained ventricular tachycardia., Status post EP study negative for inducible ventricular tachycardia     No Known Allergies   Current Outpatient Medications  Medication Sig Dispense Refill  . folic acid-vitamin b complex-vitamin c-selenium-zinc (DIALYVITE) 3 MG TABS Take 1 tablet by mouth daily.      . hydrOXYzine (ATARAX/VISTARIL) 25 MG tablet Take 25 mg by mouth at bedtime as needed for itching.    . levothyroxine (SYNTHROID, LEVOTHROID) 150 MCG tablet Take 150 mcg by mouth daily.     Marland Kitchen LIPITOR 80 MG tablet TAKE 1 BY MOUTH EVERY      EVENING 90 tablet 1  . NOVOLIN N 100 UNIT/ML injection Inject 35 Units into the skin daily.     Marland Kitchen  omeprazole (PRILOSEC) 20 MG capsule Take 20 mg by mouth daily.     . polyethylene glycol powder (MIRALAX) powder Take 17 g by mouth as needed for mild constipation.     Marland Kitchen RENVELA 800 MG tablet Take 3,200 mg by mouth 2 (two) times daily.     . SENSIPAR 30 MG tablet Take 30 mg by mouth daily.     . TOPROL XL 25 MG 24 hr tablet TAKE 1/2 BY MOUTH DAILY    *DOSE DECREASE* 45 tablet 0  . warfarin (COUMADIN) 2 MG tablet TAKE 1/2 TO 1 TABLET BY MOUTH DAILY ALTERNATING BETWEEN 1MG  AND 2MG  DAILY 45 tablet 0   No current facility-administered medications for this visit.      Past Surgical History:  Procedure Laterality Date  . cardiac stents    . CATARACT EXTRACTION    . CHOLECYSTECTOMY    . implantable loop recorder    . PERIPHERAL VASCULAR CATHETERIZATION N/A 02/20/2015   Procedure: Abdominal Aortogram;  Surgeon: Fransisco Hertz, MD;  Location: [facility] INVASIVE CV LAB CUPID;  Service: Cardiovascular;  Laterality: N/A;     No Known Allergies    Family History  Problem Relation Age of Onset  . Diabetes Mother   . Diabetes Father   . Diabetes Other   .  Hypertension Other   . Diabetes Sister   . Hypertension Sister   . Cancer Brother   . Varicose Veins Daughter      Social History Matthew Espinoza reports that he quit smoking about 30 years ago. His smoking use included cigars. He started smoking about 50 years ago. He has a 40.00 pack-year smoking history. he has never used smokeless tobacco. Matthew Espinoza reports that he does not drink alcohol.   Review of Systems CONSTITUTIONAL: No weight loss, fever, chills, weakness or fatigue.  HEENT: Eyes: No visual loss, blurred vision, double vision or yellow sclerae.No hearing loss, sneezing, congestion, runny nose or sore throat.  SKIN: No rash or itching.  CARDIOVASCULAR: per hpi RESPIRATORY: No shortness of breath, cough or sputum.  GASTROINTESTINAL: No anorexia, nausea, vomiting or diarrhea. No abdominal pain or blood.  GENITOURINARY: No burning on  urination, no polyuria NEUROLOGICAL: No headache, dizziness, syncope, paralysis, ataxia, numbness or tingling in the extremities. No change in bowel or bladder control.  MUSCULOSKELETAL: No muscle, back pain, joint pain or stiffness.  LYMPHATICS: No enlarged nodes. No history of splenectomy.  PSYCHIATRIC: No history of depression or anxiety.  ENDOCRINOLOGIC: No reports of sweating, cold or heat intolerance. No polyuria or polydipsia.  Marland Kitchen.   Physical Examination Vitals:   09/25/17 1357  BP: 109/70  Pulse: (!) 105   Vitals:   09/25/17 1357  Weight: 180 lb 12.8 oz (82 kg)  Height: 5\' 11"  (1.803 m)     Gen: resting comfortably, no acute distress HEENT: no scleral icterus, pupils equal round and reactive, no palptable cervical adenopathy,  CV: irreg, rate 100, no m/r/g, no jvd Resp: Clear to auscultation bilaterally GI: abdomen is soft, non-tender, non-distended, normal bowel sounds, no hepatosplenomegaly MSK: extremities are warm, no edema.  Skin: warm, no rash Neuro:  no focal deficits Psych: appropriate affect   Diagnostic Studies 07/2009 Echo: :LVEF 35-40%, mild LVH, apical akinesis, grade I diastolic dyfuncttion, mod LAE  07/2009 Cath:  LAD occlusion, severe RCA disease  11/2014 echo Study Conclusions  - Procedure narrative: Transthoracic echocardiography for left ventricular function evaluation, for right ventricular function evaluation, and for assessment of valvular function. Image quality was fair. There was suboptimal endocardial visualization. - Left ventricle: There was severe concentric hypertrophy. Systolic function was normal. The estimated ejection fraction was in the range of 50% to 55%. The apex and anteroapical wall appeared akinetic. The study was not technically sufficient to allow evaluation of LV diastolic dysfunction due to atrial fibrillation/flutter. Doppler parameters are consistent with high ventricular filling pressure. -  Regional wall motion abnormality: Possible akinesis of the mid anteroseptal myocardium; possible severe hypokinesis of the mid anterior myocardium. Lateral wall not well visualized. - Aortic valve: Mildly calcified annulus. Trileaflet; mildly thickened leaflets. - Mitral valve: Moderate posterior mitral annular calcification. There was mild regurgitation. - Left atrium: The atrium was severely dilated. Volume/bsa, ES, (1-plane Simpson&'s, A2C): 54.2 ml/m^2. - Right ventricle: Systolic function was mildly reduced. - Right atrium: The atrium was mildly to moderately dilated. - Tricuspid valve: There was mild regurgitation. Pulmonary pressures mildly elevated: 41 mmHg. - Systemic veins: IVC is dilated with normal respiratory variation: estimated CVP 8 mmHg. - Pericardium, extracardiac: A trivial pericardial effusion was identified.     Assessment and Plan  1. CAD  - medical therapy has been somewhat limited due to low bp's during dialysis.  - asymptomatic, continue current meds  2. Afib/Aflutter - continue coumadin for stroke prophylaxis, CHADS2Vasc is 5.  - difficultly  tolerating av nodal agents, may need consideration for antiarrhythmics. He will f/u with Dr Johney FrameAllred to discuss  3. Hyperlipidemia  - he will continue statin, request labs from [facility]aysis center   F/u 6 months   Antoine PocheJonathan F. Liel Rudden, M.D.

## 2017-09-26 ENCOUNTER — Encounter: Payer: Self-pay | Admitting: *Deleted

## 2017-09-30 ENCOUNTER — Encounter: Payer: Self-pay | Admitting: Cardiology

## 2017-10-09 ENCOUNTER — Ambulatory Visit (INDEPENDENT_AMBULATORY_CARE_PROVIDER_SITE_OTHER): Payer: Medicare Other | Admitting: *Deleted

## 2017-10-09 DIAGNOSIS — I4891 Unspecified atrial fibrillation: Secondary | ICD-10-CM | POA: Diagnosis not present

## 2017-10-09 DIAGNOSIS — Z5181 Encounter for therapeutic drug level monitoring: Secondary | ICD-10-CM | POA: Diagnosis not present

## 2017-10-09 LAB — POCT INR: INR: 3.1

## 2017-10-30 ENCOUNTER — Ambulatory Visit (INDEPENDENT_AMBULATORY_CARE_PROVIDER_SITE_OTHER): Payer: Medicare Other | Admitting: *Deleted

## 2017-10-30 DIAGNOSIS — I4891 Unspecified atrial fibrillation: Secondary | ICD-10-CM | POA: Diagnosis not present

## 2017-10-30 DIAGNOSIS — Z5181 Encounter for therapeutic drug level monitoring: Secondary | ICD-10-CM | POA: Diagnosis not present

## 2017-10-30 LAB — POCT INR: INR: 2.3

## 2017-10-30 NOTE — Patient Instructions (Signed)
Continue coumadin 1 tablet daily except 1/2 tablet on Tuesdays Recheck in 3 weeks

## 2017-11-10 ENCOUNTER — Other Ambulatory Visit: Payer: Self-pay | Admitting: Cardiology

## 2017-11-17 ENCOUNTER — Other Ambulatory Visit: Payer: Self-pay | Admitting: Cardiology

## 2017-11-21 ENCOUNTER — Ambulatory Visit: Payer: Medicare Other | Admitting: Internal Medicine

## 2017-11-27 ENCOUNTER — Ambulatory Visit (INDEPENDENT_AMBULATORY_CARE_PROVIDER_SITE_OTHER): Payer: Medicare Other | Admitting: *Deleted

## 2017-11-27 DIAGNOSIS — Z5181 Encounter for therapeutic drug level monitoring: Secondary | ICD-10-CM | POA: Diagnosis not present

## 2017-11-27 DIAGNOSIS — I4891 Unspecified atrial fibrillation: Secondary | ICD-10-CM

## 2017-11-27 LAB — POCT INR: INR: 2

## 2017-11-27 NOTE — Patient Instructions (Signed)
Take 1 1/2 tablets tonight then resume 1 tablet daily except 1/2 tablet on Tuesdays Recheck in 3 weeks

## 2017-12-09 ENCOUNTER — Telehealth: Payer: Self-pay | Admitting: Cardiology

## 2017-12-09 NOTE — Telephone Encounter (Signed)
RE: [place] [facility]  Received: 2 weeks ago  Message Contents  Matthew Espinoza, Matthew R  Espinoza, Matthew T        He called and cancelled.  Said that he does not know about rescheduling in [place] he will have to get with someone about getting there before making appt   Previous Messages    ----- Message -----  From: Matthew GeorgeSlaughter, Matthew T  Sent: 11/19/2017  3:55 PM  To: Matthew ContrasStephanie R Espinoza  Subject: needs to have appt with Dr. Johney FrameAllred set for [facility]   1/30 per Dr. Johney FrameAllred- [place] [facility] because he needs at least 30 minutes or more for this visit Called and LM asking Matthew Espinoza to call the office. vs

## 2017-12-18 ENCOUNTER — Ambulatory Visit (INDEPENDENT_AMBULATORY_CARE_PROVIDER_SITE_OTHER): Payer: Medicare Other | Admitting: *Deleted

## 2017-12-18 DIAGNOSIS — Z5181 Encounter for therapeutic drug level monitoring: Secondary | ICD-10-CM

## 2017-12-18 DIAGNOSIS — I4891 Unspecified atrial fibrillation: Secondary | ICD-10-CM

## 2017-12-18 LAB — POCT INR: INR: 2.1

## 2017-12-18 NOTE — Patient Instructions (Signed)
Continue 1 tablet daily except 1/2 tablet on Tuesdays  Recheck in 4 weeks 

## 2018-01-06 ENCOUNTER — Other Ambulatory Visit: Payer: Self-pay | Admitting: *Deleted

## 2018-01-06 MED ORDER — WARFARIN SODIUM 2 MG PO TABS: ORAL_TABLET | ORAL | 3 refills | Status: AC

## 2018-01-13 ENCOUNTER — Ambulatory Visit (INDEPENDENT_AMBULATORY_CARE_PROVIDER_SITE_OTHER): Payer: Medicare Other | Admitting: *Deleted

## 2018-01-13 DIAGNOSIS — Z5181 Encounter for therapeutic drug level monitoring: Secondary | ICD-10-CM | POA: Diagnosis not present

## 2018-01-13 DIAGNOSIS — I4891 Unspecified atrial fibrillation: Secondary | ICD-10-CM

## 2018-01-13 LAB — POCT INR: INR: 2.5

## 2018-01-13 NOTE — Patient Instructions (Signed)
Continue 1 tablet daily except 1/2 tablet on Tuesdays  Recheck in 6 weeks 

## 2018-02-17 ENCOUNTER — Ambulatory Visit: Payer: Self-pay | Admitting: *Deleted

## 2018-02-18 DEATH — deceased

## 2018-03-11 ENCOUNTER — Encounter (INDEPENDENT_AMBULATORY_CARE_PROVIDER_SITE_OTHER): Payer: Medicare Other | Admitting: Ophthalmology

## 2018-06-13 IMAGING — CT CT ABD-PELV W/O CM
2 of 4 series · 7 of 46 positions shown, 8 images · non-contrast
Comparison: 01/10/2017

CLINICAL DATA: Follow-up hematoma left flank region, on warfarin

EXAM:
CT ABDOMEN AND PELVIS WITHOUT CONTRAST
TECHNIQUE: Multidetector CT imaging of the abdomen and pelvis was performed
following the standard protocol without IV contrast.

[Series 201: routine, idose (2) · axial · 0.88mm/px · z∈[-496,-106]mm · 4 of 104 slices shown, 5 images]
[im 13/104  soft-tissue]
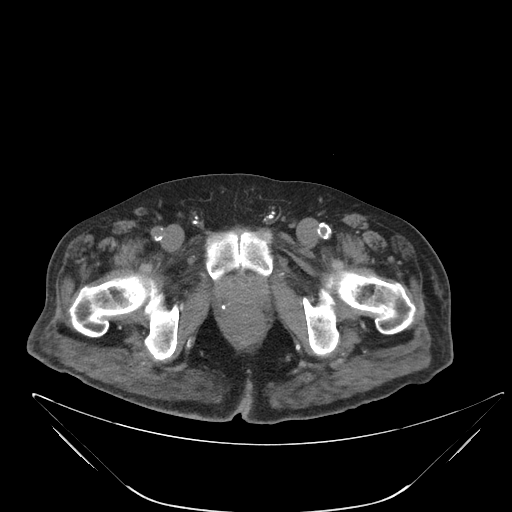
[im 13/104  bone]
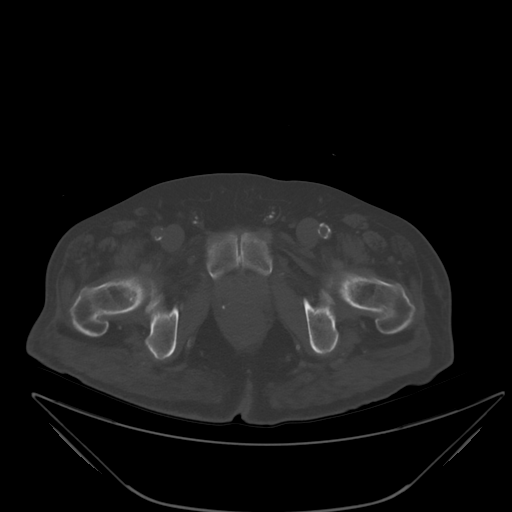
[im 39/104  soft-tissue]
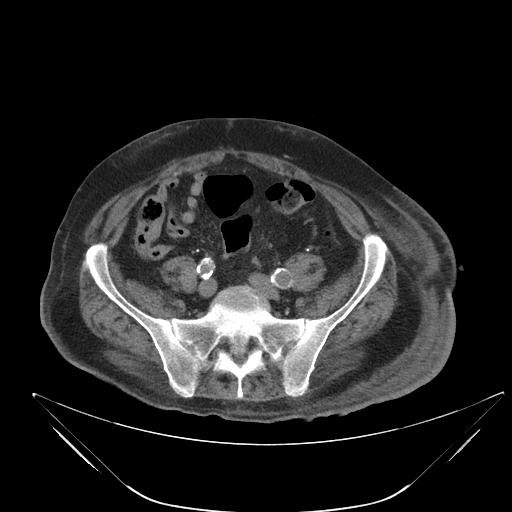
[im 65/104  soft-tissue]
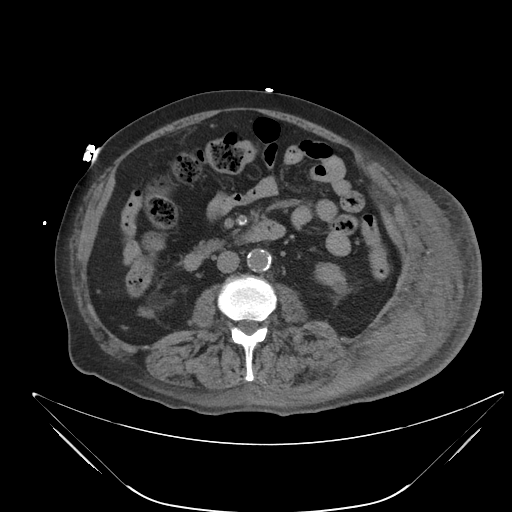
[im 91/104  soft-tissue]
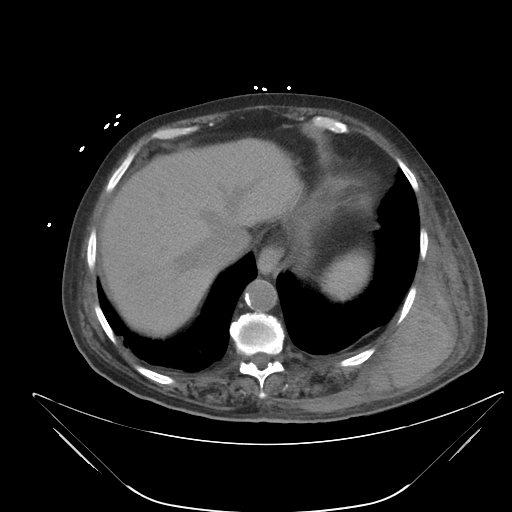

[Series 203: coronals, idose (2) · coronal · 0.45mm/px · 3 of 131 slices shown]
[im 44/131  soft-tissue]
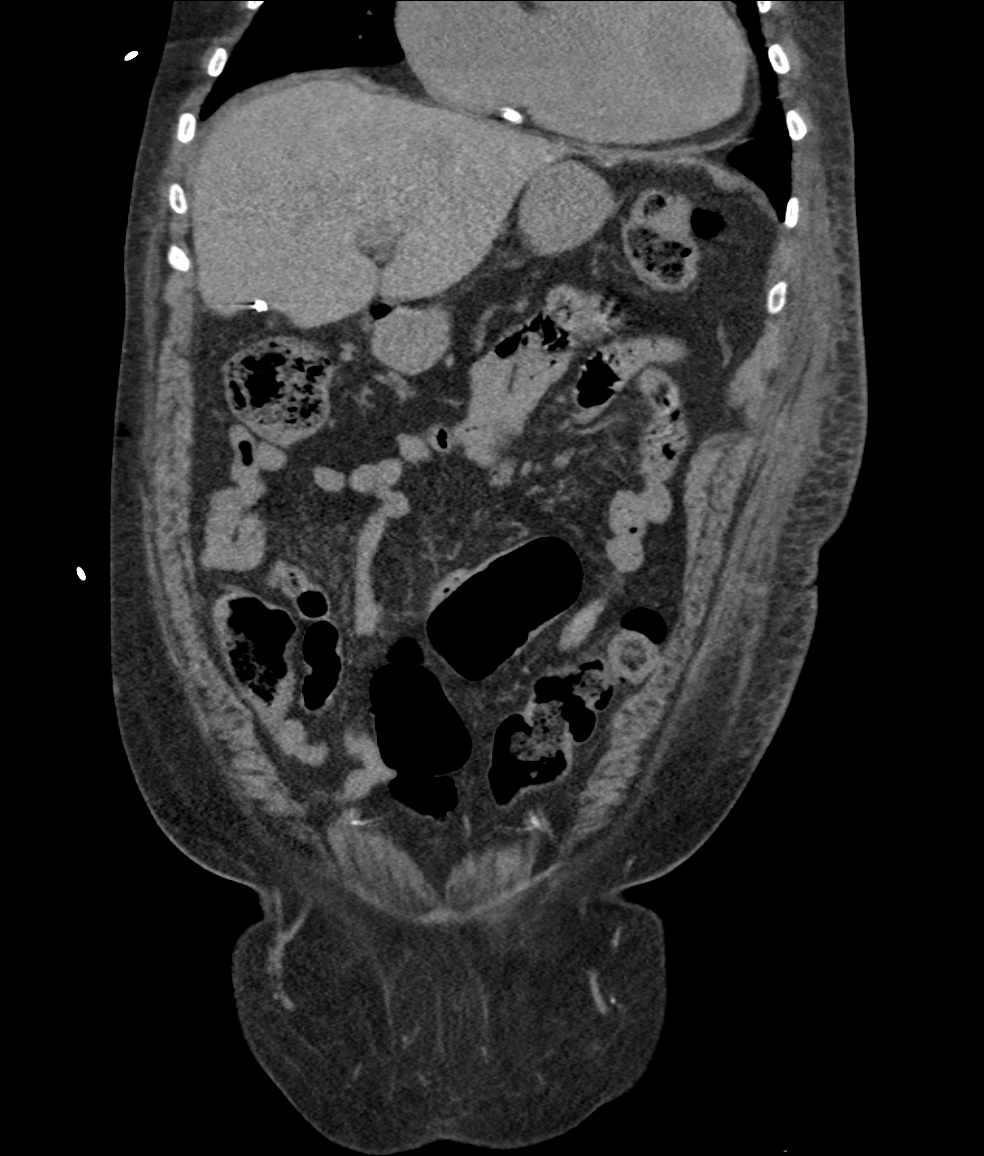
[im 58/131  soft-tissue]
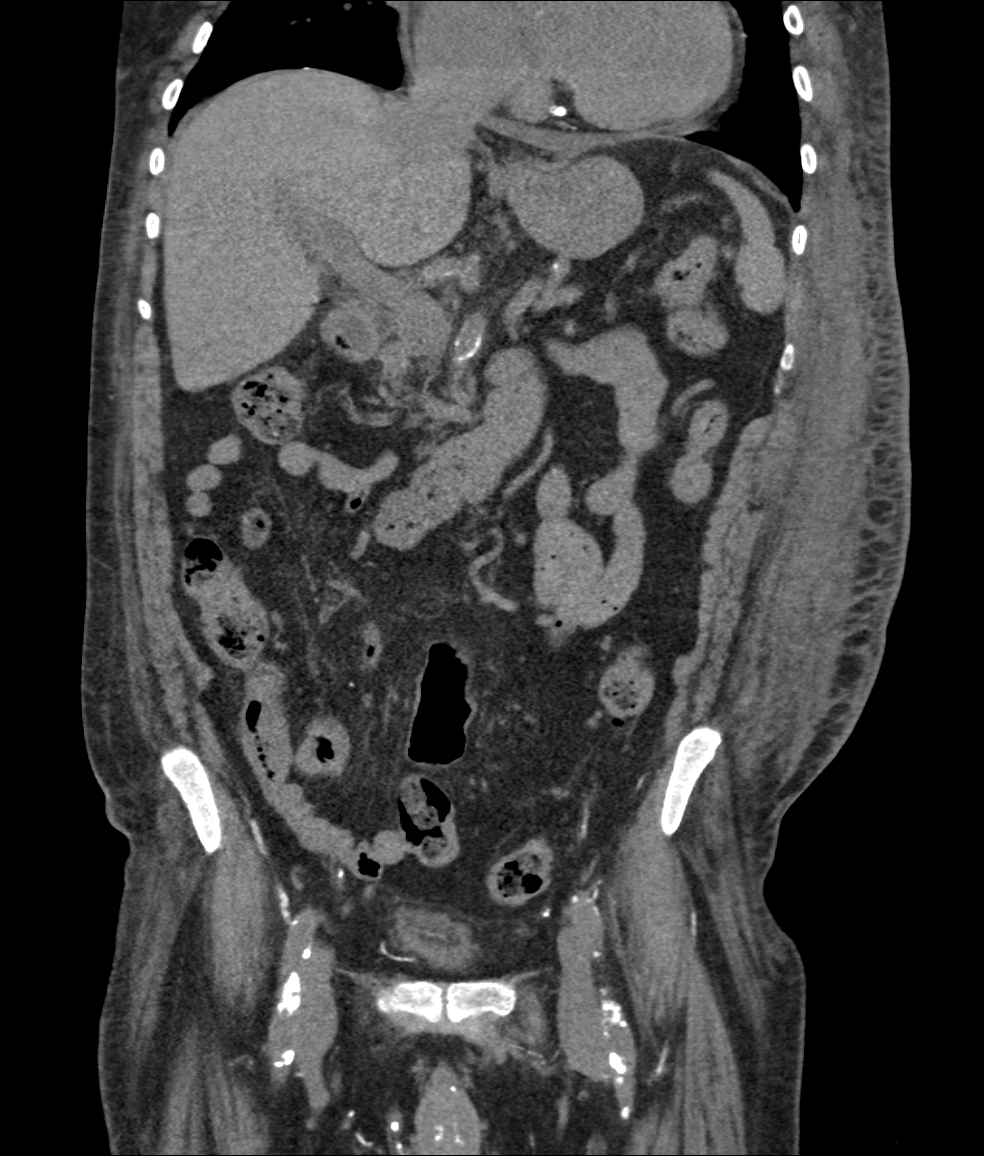
[im 73/131  soft-tissue]
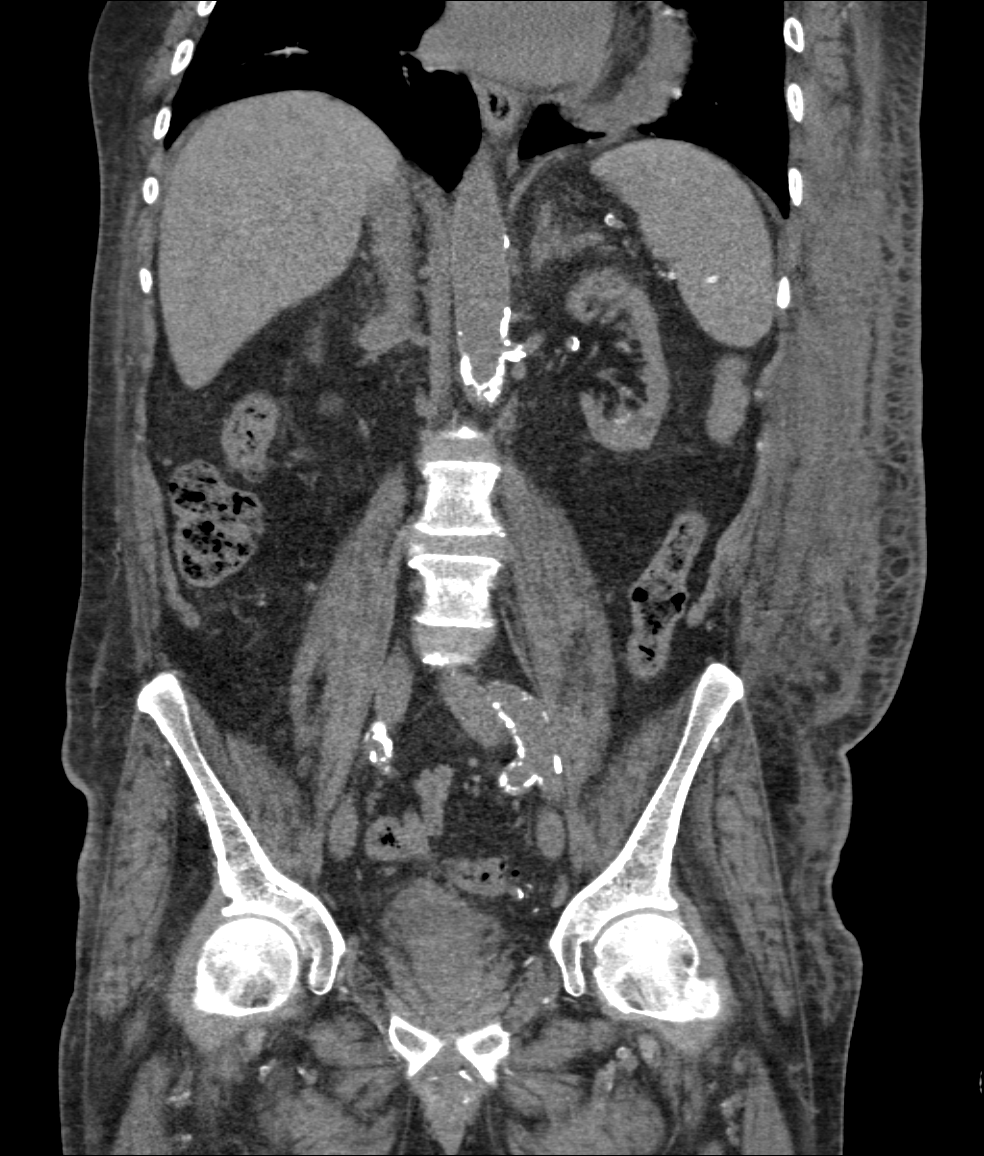

[7 of 46 positions shown; findings below may reference images not displayed]

FINDINGS: Lower chest: Cardiomegaly again noted. Atherosclerotic
calcifications of coronary arteries. Lung bases shows no acute
findings. No lower rib fractures are noted.

Hepatobiliary: Again noted status post cholecystectomy. No
intrahepatic biliary ductal dilatation.

Pancreas: Unremarkable. No pancreatic ductal dilatation or
surrounding inflammatory changes.

Spleen: No splenic injury or perisplenic hematoma. Small accessory
splenule again noted.

Adrenals/Urinary Tract: Again noted bilateral cortical thinning and
renal atrophy. Stable bilateral renal cysts and hyperattenuating
lesions probable hemorrhagic cysts. This cannot be characterized
without IV contrast. No hydronephrosis or hydroureter.

Stomach/Bowel: There is no small bowel obstruction. No thickened or
dilated small bowel loops 3. Some colonic stool are noted in right
colon and transverse colon. Colonic stool noted in distal descending
colon and proximal sigmoid colon. Some colonic gas noted mid sigmoid
colon. Moderate stool noted within rectum. The rectum measures 6 cm
in diameter. No evidence of colitis or diverticulitis. Normal
appendix partially visualized in axial image 72.

Vascular/Lymphatic: Again noted atherosclerotic calcifications of
abdominal aorta and iliac arteries. No retroperitoneal or mesenteric
adenopathy.

Reproductive: No pelvic mass or adenopathy. Limited assessment of
urinary bladder which is empty. Prostate gland is normal size.

Other: Again noted a large hematoma within left inferior
posterolateral thoracic musculature/ left flank. On axial image 18
measures 15 x 6.3 cm without significant change from prior exam. On
coronal image 94 measures about 18 cm cranial caudally by 7.4 cm
transverse dimension. There is no significant change in size in
appearance from prior exam. There is stranding of subcutaneous fat
in left flank probable contusion or adjacent edema.

Musculoskeletal: Sagittal images of the spine shows mild
degenerative changes lower thoracic spine. No acute fractures are
noted. No pelvic fractures are not identified.
IMPRESSION: 1. Again noted a large hematoma within left inferior posterolateral
thoracic musculature/ left flank. On axial image 18 measures 15 x
6.3 cm without significant change from prior exam. On coronal image
94 measures about 18 cm cranial caudally by 7.4 cm transverse
dimension. There is no significant change in size in appearance from
prior exam. There is stranding of subcutaneous fat in left flank
probable contusion or adjacent edema.
2. Status post cholecystectomy.
3. Again noted atherosclerotic calcifications of abdominal aorta
iliac arteries. Atherosclerotic calcifications of coronary arteries.
4. Again noted bilateral atrophic kidneys with cortical thinning.
Stable hyperdense and hypodense bilateral renal lesions which cannot
be characterized without IV contrast.
5. No small bowel or colonic obstruction. No pericecal inflammation.
Normal appendix partially visualized axial image 72.
6. Colonic stool as described above without definite evidence of
acute colitis or diverticulitis.
